# Patient Record
Sex: Male | Born: 1960 | Race: White | Hispanic: No | Marital: Married | Smoking: Former smoker
Health system: Southern US, Community
[De-identification: ages and names within clinical notes are randomized; demographics above are authoritative.]

## PROBLEM LIST (undated history)

## (undated) DIAGNOSIS — R7303 Prediabetes: Secondary | ICD-10-CM

## (undated) DIAGNOSIS — M7989 Other specified soft tissue disorders: Secondary | ICD-10-CM

## (undated) DIAGNOSIS — E785 Hyperlipidemia, unspecified: Secondary | ICD-10-CM

## (undated) DIAGNOSIS — G4730 Sleep apnea, unspecified: Secondary | ICD-10-CM

## (undated) DIAGNOSIS — J449 Chronic obstructive pulmonary disease, unspecified: Secondary | ICD-10-CM

## (undated) DIAGNOSIS — E559 Vitamin D deficiency, unspecified: Secondary | ICD-10-CM

## (undated) DIAGNOSIS — K829 Disease of gallbladder, unspecified: Secondary | ICD-10-CM

## (undated) DIAGNOSIS — M1990 Unspecified osteoarthritis, unspecified site: Secondary | ICD-10-CM

## (undated) DIAGNOSIS — R0602 Shortness of breath: Secondary | ICD-10-CM

## (undated) DIAGNOSIS — T7840XA Allergy, unspecified, initial encounter: Secondary | ICD-10-CM

## (undated) DIAGNOSIS — K2270 Barrett's esophagus without dysplasia: Secondary | ICD-10-CM

## (undated) DIAGNOSIS — M549 Dorsalgia, unspecified: Secondary | ICD-10-CM

## (undated) DIAGNOSIS — G4733 Obstructive sleep apnea (adult) (pediatric): Secondary | ICD-10-CM

## (undated) DIAGNOSIS — K219 Gastro-esophageal reflux disease without esophagitis: Secondary | ICD-10-CM

## (undated) DIAGNOSIS — R079 Chest pain, unspecified: Secondary | ICD-10-CM

## (undated) HISTORY — DX: Vitamin D deficiency, unspecified: E55.9

## (undated) HISTORY — DX: Allergy, unspecified, initial encounter: T78.40XA

## (undated) HISTORY — DX: Hyperlipidemia, unspecified: E78.5

## (undated) HISTORY — DX: Gastro-esophageal reflux disease without esophagitis: K21.9

## (undated) HISTORY — DX: Sleep apnea, unspecified: G47.30

## (undated) HISTORY — DX: Unspecified osteoarthritis, unspecified site: M19.90

## (undated) HISTORY — DX: Shortness of breath: R06.02

## (undated) HISTORY — PX: CARPAL TUNNEL RELEASE: SHX101

## (undated) HISTORY — DX: Barrett's esophagus without dysplasia: K22.70

## (undated) HISTORY — PX: KNEE ARTHROSCOPY: SUR90

## (undated) HISTORY — DX: Prediabetes: R73.03

## (undated) HISTORY — PX: NECK SURGERY: SHX720

## (undated) HISTORY — DX: Chest pain, unspecified: R07.9

## (undated) HISTORY — PX: APPENDECTOMY: SHX54

## (undated) HISTORY — DX: Obstructive sleep apnea (adult) (pediatric): G47.33

## (undated) HISTORY — DX: Other specified soft tissue disorders: M79.89

## (undated) HISTORY — PX: SHOULDER SURGERY: SHX246

## (undated) HISTORY — DX: Dorsalgia, unspecified: M54.9

## (undated) HISTORY — DX: Disease of gallbladder, unspecified: K82.9

## (undated) HISTORY — PX: CHOLECYSTECTOMY: SHX55

## (undated) HISTORY — DX: Chronic obstructive pulmonary disease, unspecified: J44.9

---

## 2000-07-18 ENCOUNTER — Inpatient Hospital Stay (HOSPITAL_COMMUNITY): Admission: EM | Admit: 2000-07-18 | Discharge: 2000-07-20 | Payer: Self-pay | Admitting: Emergency Medicine

## 2000-07-18 ENCOUNTER — Encounter: Payer: Self-pay | Admitting: Emergency Medicine

## 2001-07-01 ENCOUNTER — Emergency Department (HOSPITAL_COMMUNITY): Admission: EM | Admit: 2001-07-01 | Discharge: 2001-07-02 | Payer: Self-pay | Admitting: Internal Medicine

## 2001-07-02 ENCOUNTER — Encounter: Payer: Self-pay | Admitting: Emergency Medicine

## 2004-09-03 ENCOUNTER — Encounter: Admission: RE | Admit: 2004-09-03 | Discharge: 2004-09-03 | Payer: Self-pay | Admitting: Family Medicine

## 2004-11-30 ENCOUNTER — Encounter: Admission: RE | Admit: 2004-11-30 | Discharge: 2004-11-30 | Payer: Self-pay | Admitting: Family Medicine

## 2007-01-22 ENCOUNTER — Ambulatory Visit (HOSPITAL_BASED_OUTPATIENT_CLINIC_OR_DEPARTMENT_OTHER): Admission: RE | Admit: 2007-01-22 | Discharge: 2007-01-22 | Payer: Self-pay | Admitting: Orthopedic Surgery

## 2007-02-17 ENCOUNTER — Ambulatory Visit (HOSPITAL_BASED_OUTPATIENT_CLINIC_OR_DEPARTMENT_OTHER): Admission: RE | Admit: 2007-02-17 | Discharge: 2007-02-17 | Payer: Self-pay | Admitting: Orthopedic Surgery

## 2008-01-10 ENCOUNTER — Emergency Department (HOSPITAL_COMMUNITY): Admission: EM | Admit: 2008-01-10 | Discharge: 2008-01-10 | Payer: Self-pay | Admitting: Emergency Medicine

## 2008-02-03 ENCOUNTER — Encounter (INDEPENDENT_AMBULATORY_CARE_PROVIDER_SITE_OTHER): Payer: Self-pay | Admitting: Orthopedic Surgery

## 2008-02-03 ENCOUNTER — Ambulatory Visit (HOSPITAL_BASED_OUTPATIENT_CLINIC_OR_DEPARTMENT_OTHER): Admission: RE | Admit: 2008-02-03 | Discharge: 2008-02-03 | Payer: Self-pay | Admitting: Orthopedic Surgery

## 2008-10-12 ENCOUNTER — Ambulatory Visit: Payer: Self-pay | Admitting: Gastroenterology

## 2008-10-12 DIAGNOSIS — K591 Functional diarrhea: Secondary | ICD-10-CM | POA: Insufficient documentation

## 2008-10-12 DIAGNOSIS — K219 Gastro-esophageal reflux disease without esophagitis: Secondary | ICD-10-CM | POA: Insufficient documentation

## 2008-10-12 DIAGNOSIS — K644 Residual hemorrhoidal skin tags: Secondary | ICD-10-CM | POA: Insufficient documentation

## 2009-01-02 ENCOUNTER — Ambulatory Visit: Payer: Self-pay | Admitting: Gastroenterology

## 2009-01-16 ENCOUNTER — Ambulatory Visit: Payer: Self-pay | Admitting: Gastroenterology

## 2009-01-16 ENCOUNTER — Encounter: Payer: Self-pay | Admitting: Gastroenterology

## 2009-01-19 ENCOUNTER — Encounter: Payer: Self-pay | Admitting: Gastroenterology

## 2009-01-31 ENCOUNTER — Telehealth: Payer: Self-pay | Admitting: Gastroenterology

## 2009-02-16 ENCOUNTER — Encounter: Payer: Self-pay | Admitting: Gastroenterology

## 2009-03-15 ENCOUNTER — Encounter: Payer: Self-pay | Admitting: Gastroenterology

## 2009-03-30 ENCOUNTER — Observation Stay (HOSPITAL_COMMUNITY): Admission: EM | Admit: 2009-03-30 | Discharge: 2009-04-01 | Payer: Self-pay | Admitting: Emergency Medicine

## 2009-03-30 ENCOUNTER — Encounter (INDEPENDENT_AMBULATORY_CARE_PROVIDER_SITE_OTHER): Payer: Self-pay | Admitting: Surgery

## 2009-04-05 ENCOUNTER — Encounter: Payer: Self-pay | Admitting: Gastroenterology

## 2009-12-14 ENCOUNTER — Ambulatory Visit (HOSPITAL_COMMUNITY)
Admission: RE | Admit: 2009-12-14 | Discharge: 2009-12-15 | Payer: Self-pay | Source: Home / Self Care | Admitting: Orthopedic Surgery

## 2010-03-18 IMAGING — RF DG CERVICAL SPINE 2 OR 3 VIEWS
1 series · 5 of 5 positions shown · non-contrast
Comparison: Cervical spine x-rays 12/11/2009.

CLINICAL DATA: ACDF C5-C7.

OPERATIVE CERVICAL SPINE - 2-3 VIEW 12/14/2009:

[Series 1: run · 5 of 5 slices shown]
[im 1/5]
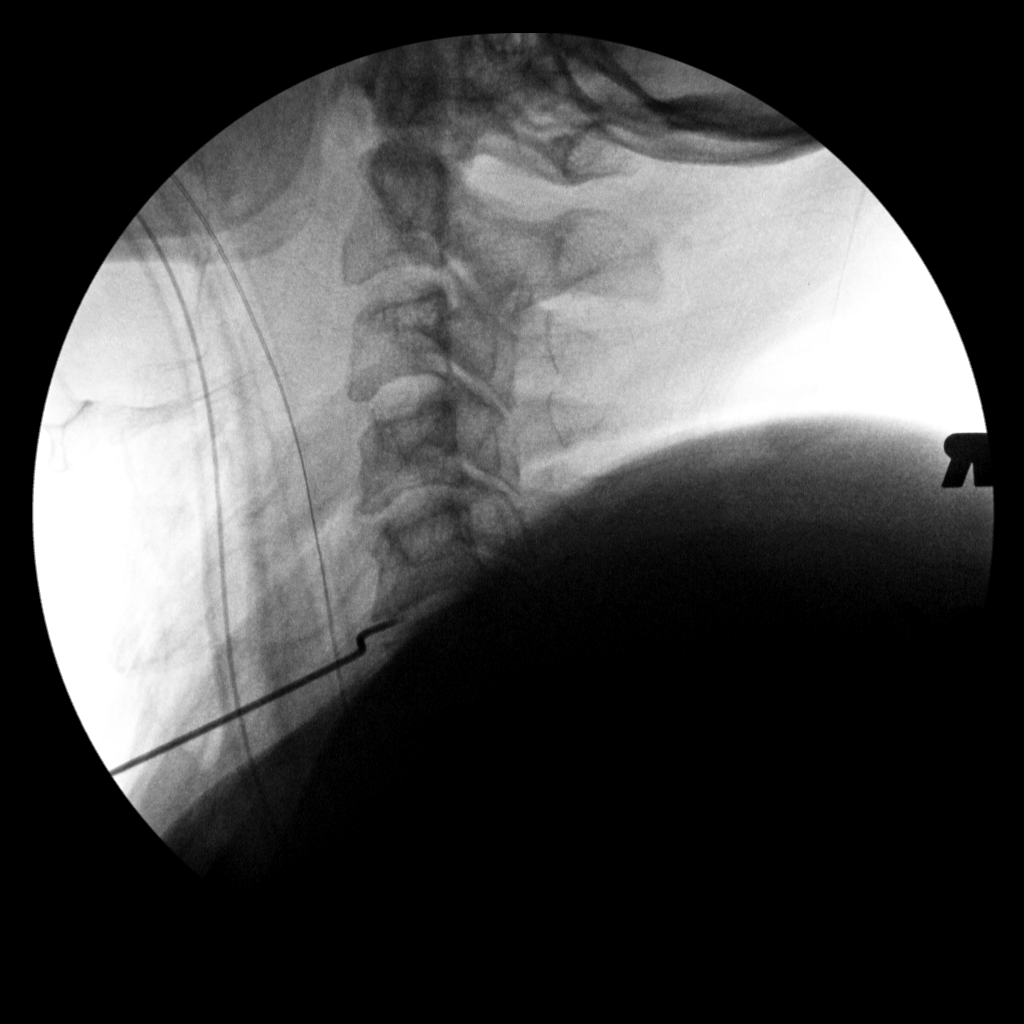
[im 2/5]
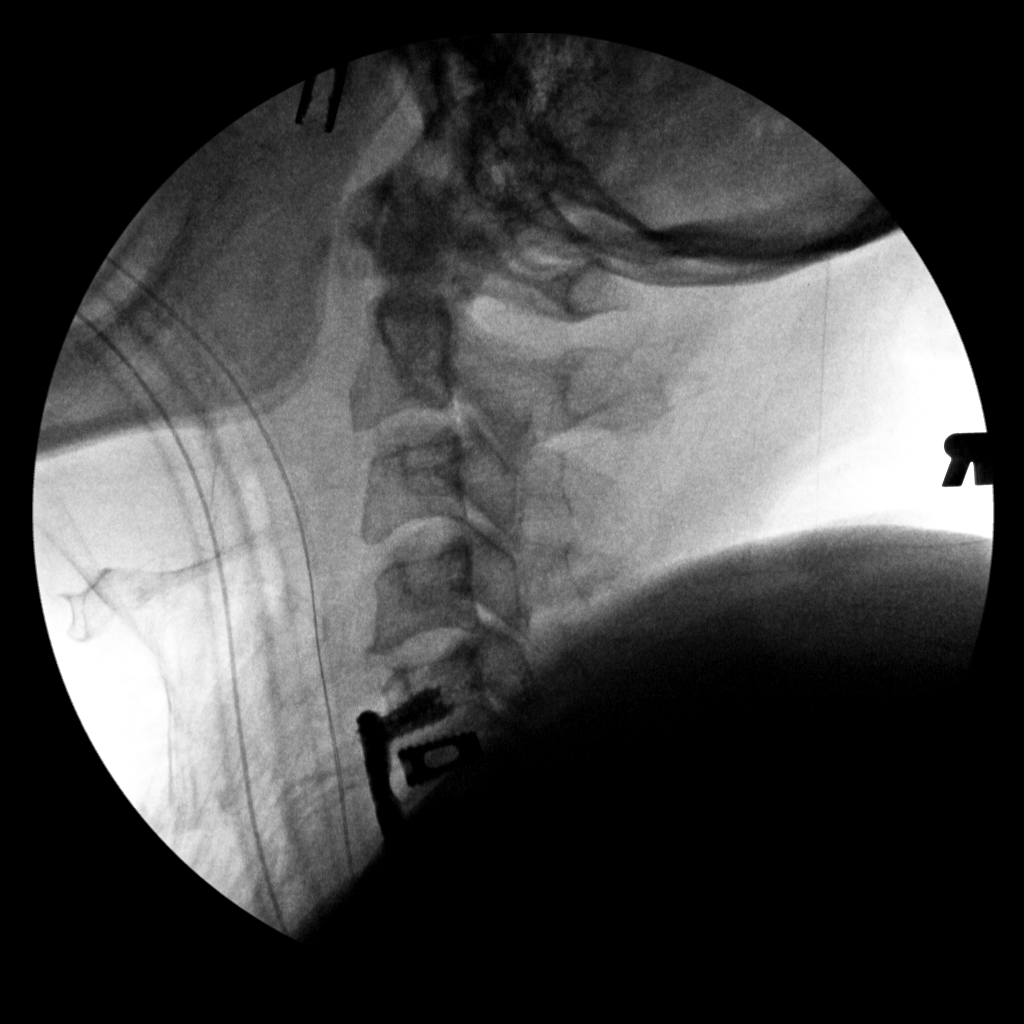
[im 3/5]
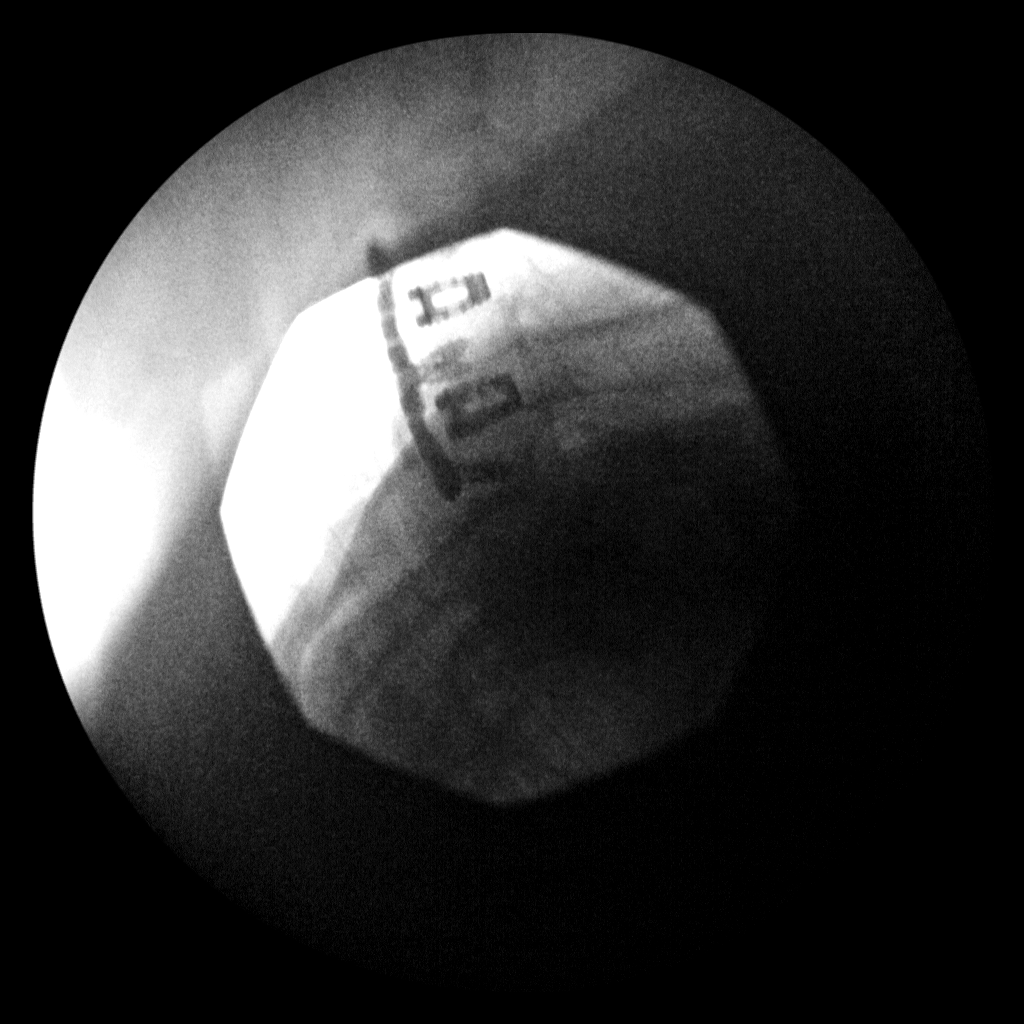
[im 4/5]
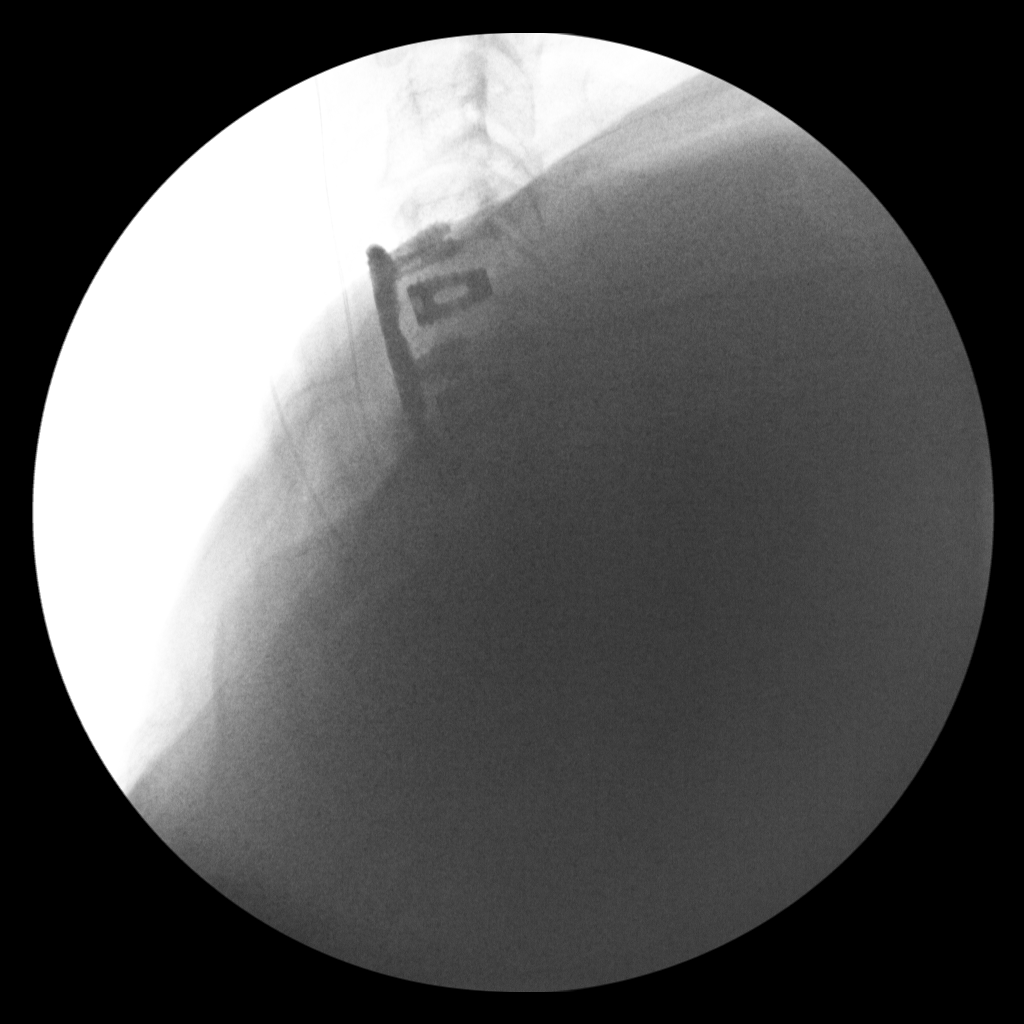
[im 5/5]
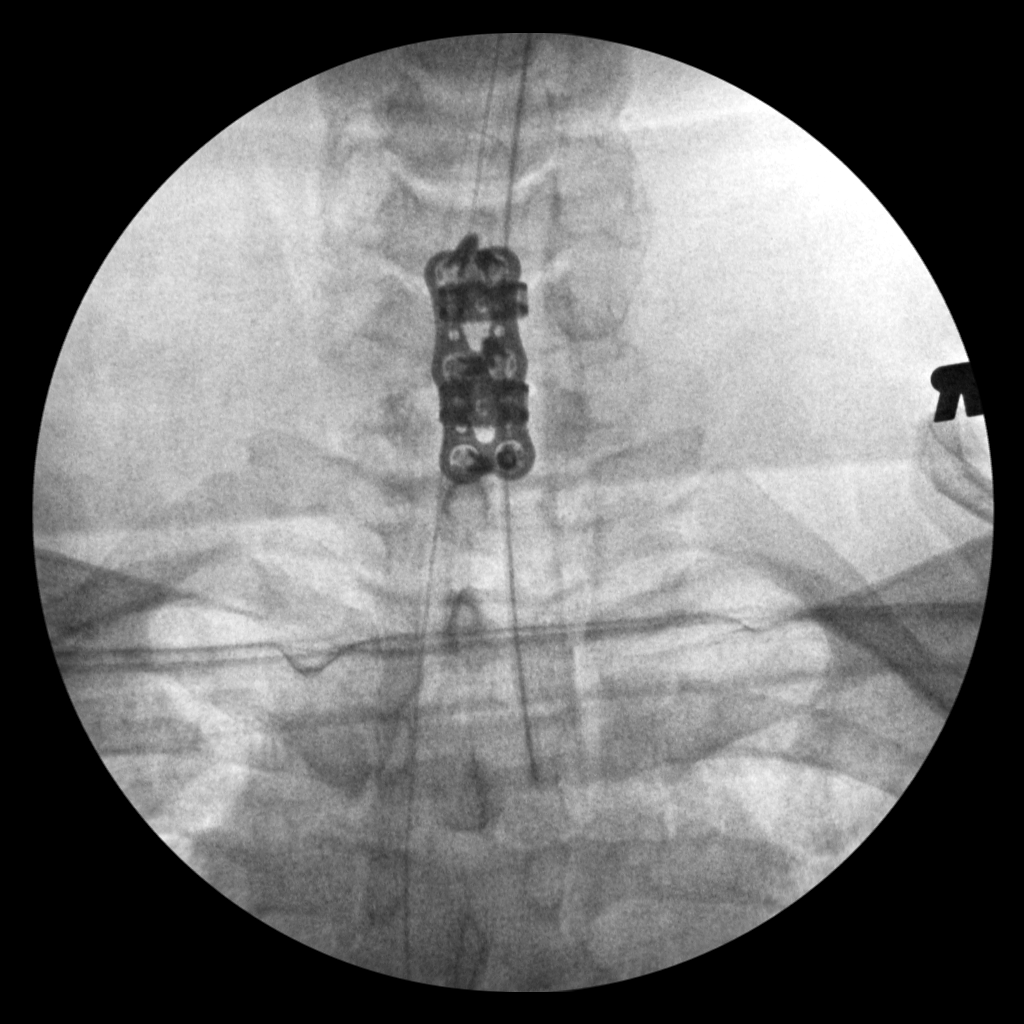

[5 of 5 positions shown; findings below may reference images not displayed]

FINDINGS: 5 spot images from the C-arm fluoroscopic device, AP and
lateral views of the cervical spine, were submitted for
interpretation post-operatively.  Initial image at 1119 hours
demonstrates the localizer needle projected over the C5-6 disc
space.  The completion images at 4313 and 9399 hours demonstrate
ACDF with hardware from C5-C7 with interbody fusion plugs.
Alignment appears anatomic.  The radiologic technologist documented
24 seconds of fluoroscopy time.
IMPRESSION: C5-C7 ACDF with anatomic alignment.

## 2010-12-11 ENCOUNTER — Ambulatory Visit (HOSPITAL_COMMUNITY)
Admission: RE | Admit: 2010-12-11 | Discharge: 2010-12-11 | Payer: Self-pay | Source: Home / Self Care | Attending: Otolaryngology | Admitting: Otolaryngology

## 2011-01-27 LAB — CBC
Hemoglobin: 15.3 g/dL (ref 13.0–17.0)
MCV: 90 fL (ref 78.0–100.0)
RBC: 4.92 MIL/uL (ref 4.22–5.81)
WBC: 9.8 10*3/uL (ref 4.0–10.5)

## 2011-02-19 LAB — DIFFERENTIAL
Basophils Absolute: 0 10*3/uL (ref 0.0–0.1)
Basophils Relative: 0 % (ref 0–1)
Eosinophils Relative: 3 % (ref 0–5)
Monocytes Absolute: 1.3 10*3/uL — ABNORMAL HIGH (ref 0.1–1.0)

## 2011-02-19 LAB — COMPREHENSIVE METABOLIC PANEL
Albumin: 3.8 g/dL (ref 3.5–5.2)
Alkaline Phosphatase: 96 U/L (ref 39–117)
BUN: 11 mg/dL (ref 6–23)
CO2: 25 mEq/L (ref 19–32)
Chloride: 102 mEq/L (ref 96–112)
Creatinine, Ser: 1.02 mg/dL (ref 0.4–1.5)
GFR calc Af Amer: >60 mL/min (ref >60)
Glucose, Bld: 116 mg/dL — ABNORMAL HIGH (ref 70–99)
Total Protein: 7.5 g/dL (ref 6.0–8.3)

## 2011-02-19 LAB — CBC
HCT: 38.8 % — ABNORMAL LOW (ref 39.0–52.0)
HCT: 39.4 % (ref 39.0–52.0)
HCT: 44.8 % (ref 39.0–52.0)
Hemoglobin: 13.6 g/dL (ref 13.0–17.0)
Hemoglobin: 15.2 g/dL (ref 13.0–17.0)
MCV: 88.4 fL (ref 78.0–100.0)
Platelets: 229 10*3/uL (ref 150–400)
Platelets: 238 10*3/uL (ref 150–400)
RBC: 4.4 MIL/uL (ref 4.22–5.81)
WBC: 14.7 10*3/uL — ABNORMAL HIGH (ref 4.0–10.5)
WBC: 19.3 10*3/uL — ABNORMAL HIGH (ref 4.0–10.5)

## 2011-02-19 LAB — URINALYSIS, ROUTINE W REFLEX MICROSCOPIC
Glucose, UA: NEGATIVE mg/dL
Specific Gravity, Urine: 1.025 (ref 1.005–1.030)

## 2011-02-19 LAB — HEPATIC FUNCTION PANEL
AST: 42 U/L — ABNORMAL HIGH (ref 0–37)
Albumin: 2.9 g/dL — ABNORMAL LOW (ref 3.5–5.2)
Total Bilirubin: 1.3 mg/dL — ABNORMAL HIGH (ref 0.3–1.2)

## 2011-02-19 LAB — LIPASE, BLOOD: Lipase: 12 U/L (ref 11–59)

## 2011-02-19 LAB — CREATININE, SERUM
Creatinine, Ser: 1.13 mg/dL (ref 0.4–1.5)
GFR calc non Af Amer: >60 mL/min (ref >60)

## 2011-02-19 LAB — BILIRUBIN, TOTAL: Total Bilirubin: 1.3 mg/dL — ABNORMAL HIGH (ref 0.3–1.2)

## 2011-02-19 LAB — URINE MICROSCOPIC-ADD ON

## 2011-03-26 NOTE — Op Note (Signed)
NAMEZAYLAN, KISSOON                 ACCOUNT NO.:  [id number]   MEDICAL RECORD NO.:  [id number]          PATIENT TYPE:  INP   LOCATION:  0109                         FACILITY:  [facility]   PHYSICIAN:  Ardeth Sportsman, MD     DATE OF BIRTH:  November 03, 1961   DATE OF PROCEDURE:  03/30/2009  DATE OF DISCHARGE:                               OPERATIVE REPORT   PRIMARY CARE PHYSICIAN:  Brett Canales A. Cleta Alberts, M.D.   ER PHYSICIAN:  Trudi Ida. Denton Lank, M.D.   SURGEON:  Karie Soda, M.D.   ASSISTANT:  Gabrielle Dare. Janee Morn, M.D.   PREOPERATIVE DIAGNOSIS:  Acute cholecystitis.   POSTOPERATIVE DIAGNOSIS:  Acute cholecystitis.   PROCEDURE PERFORMED:  Laparoscopic lysis of adhesions x 45 minutes which  was half of the case.  Laparoscopic cholecystectomy.   ANESTHESIA:  1. General anesthesia.  2. Local anesthetic in a field block all port sites.   SPECIMEN:  Gallbladder.   DRAINS:  19-French Harrison Mons drain rests behind the right lobe of the liver  and then comes up to lay over the porta hepatis.   ESTIMATED BLOOD LOSS:  100 mL.   COMPLICATIONS:  None.   INDICATIONS:  Mr. Odor is a 50 year old heavy smoker who has had 3  days of worsening abdominal pain and was convinced by his wife to get  evaluated and urgent care was strongly suspicious for cholecystitis and  was sent to the [facility] emergency department where ultrasound  confirmed this with significant leukocytosis.  Surgical consultation was  requested.   The anatomy and physiology of hepatobiliary pancreatic function was  discussed.  Pathophysiology of cholecystitis was discussed.  Options  discussed, recommendations made for laparoscopic cholecystectomy.  Risks, benefits, alternatives discussed.  Questions answered, he agreed  to proceed.   OPERATIVE FINDINGS:  He had a very thickened gallbladder wall with dense  adhesions of omentum and mesocolon and duodenum to his gallbladder which  was full of large stones consistent with acute  cholecystitis.   DESCRIPTION OF PROCEDURE:  After informed consent was confirmed, he was  already on IV Zosyn.  He had sequential pressure devices active the  entire case.  He underwent anesthesia without much difficulty.  Foley  catheter sterilely placed.  He was positioned supine with both arms  tucked.  His abdomen was clipped, prepped and draped in sterile fashion.   A #5 mm port was placed in right upper quadrant using optical entry  technique with the patient steep reverse Trendelenburg and right-side  up.  Camera inspection revealed no intra-abdominal injury but a large  wad of omentum and inflammation on his liver.  Under visualization,  final ports were placed in the right anterior axillary line in the right  flank.  Another one was placed through the umbilicus and 12 mm port was  placed in the subxiphoid region and another port was placed in the  supraumbilically along the midline for actually total of 5 ports, one in  the right flank, one the right upper quadrant, one through the  umbilicus, one supraumbilically and one the subxiphoid region. Of note  he had a lot of wheezing and bucking during the case and took some time  to control from heavy smoking.  He had some moderate secretions as well.    The greater omentum was carefully peeled back and I encountered some  bleeding controlled.  I could see the dome of the gallbladder, with that  I tried to grasp it but it was too thickened to grasp.  Using careful  mainly blunt as well as focused hook cautery dissection, I was able to  free the greater omentum off the anterior wall of the gallbladder.  Enough working space was created such that I could put decompression in  and aspirate thickened dark bile to help contract the gallbladder down  although it was obvious the gallbladder wall was very thick.  Gallbladder fundus was grasped, elevated cephalad and I was able to  further peel off the greater omentum and mesocolon and  duodenum down.   We had to get another grasper help push the retroperitoneum posteriorly  since his omentum and colon and duodenum were so inflamed and swollen it  limited our working space.  With that, we could see the infundibulum of  the gallbladder.  I was able to score the peritoneum on the  posterolateral and anteromedial aspects of the gallbladder to the liver.  I was able to get some better mobilization and get the infundibulum  pulled out and lateral pulled inferolaterally.  Eventually with that I  was able to find the cystic duct.  Further dissection was done around it  circumferentially until I could see two structures going from the  gallbladder down to the porta hepatis consistent with cystic duct and  cystic artery.  One clip on gallbladder side and four clips were placed  more distally on the cystic duct.  I decided not to do a cholangiogram  as there was extremely thickened and the angulation was difficult to  probably cannulate anyway.  His LFTs had been normal so I went ahead and  transected the cystic duct.  The anterior branch of the cystic artery  was isolated and two clips on gallbladder side, one clip slightly more  proximally & this was transected.  On further dissection was another  branch coming off the posterior wall so therefore the initial cystic  artery was more of anterior branch.  On dissection there was some  bleeding on the posterior artery posterior branch of the cystic artery  which I was able to clamp and isolate it, further skeletonized to make  sure I was not involved in hepatic artery and placed two clips on that  for good control.   The gallbladder was carefully elevated and the cautery was used at high-  dose to help free the gallbladder off the posterior wall of the  posterior gallbladder off the liver bed.  Ended up having to leave some  parts of the thickened gallbladder wall as well to avoid any  intrahepatic injury.  Eventually was able to  completely free it all off.  Gallbladder was placed inside an EndoCatch bag and had to remove out the  subxiphoid port and extend excision about 4 cm to get it out it was so  thick and even that took some pressure.  The port was replaced.   Camera inspection revealed hemostasis was pretty good.  Copious  irrigation was done.  Meticulous hemostasis was assured on the  gallbladder fossa, on the hepatic fossa with the gallbladder was.  Clips  were intact  on the posterior anterior branch cystic artery and the  cystic stump.  Over 4 liters irrigation was done with clear return at  the end.  Hemostasis good on the greater omentum.  The colon duodenum  was inspected, there was no evidence of any injury.  There was no injury  elsewhere.  The capnoperitoneum was evacuated.  A drain was placed.   Drain was placed as noted above.  It was secured to the skin using 2-0  nylon stitch.  Capnoperitoneum was evacuated.  Ports removed.  The  subxiphoid fascial defect was closed using interrupted 0 Vicryl figure-  of-eight stitches x4.  Skin was closed using 4-0 Monocryl stitch.  Sterile dressings applied.  The patient was due to be extubated in the  recovery room, about to discuss things with the family.      Ardeth Sportsman, MD  Electronically Signed     SCG/MEDQ  D:  03/30/2009  T:  03/31/2009  Job:  [id number]

## 2011-03-26 NOTE — Op Note (Signed)
Brian Steele, Brian Steele                 ACCOUNT NO.:  [id number]   MEDICAL RECORD NO.:  [id number]          PATIENT TYPE:  AMB   LOCATION:  DSC                          FACILITY:  [facility]   PHYSICIAN:  Matthew A. 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   PROCEDURE:  Left index finger proximal interphalangeal joint  capsulectomy and mass excision.   SURGEON:  Artist Pais. Mina Marble, M.D.   ASSISTANT:  None.   ANESTHESIA:  General.   TOURNIQUET TIME:  30 minutes.   No complications.  No drains.   OPERATIVE REPORT:   COMPLICATIONS:  The patient was taken to the operating suite.  After the  induction of adequate general anesthesia, the left upper extremity was  prepped and draped in a sterile fashion.  An Esmarch was used to  exsanguinate the limb, a tourniquet inflated to 250 mm.  At this point  in time a lazy S incision was made over the PIP joint using his old  incision.  Flaps were raised accordingly and tied down with 4-0 nylon.  A cystic mass was seen coming off the ulnar side of the proximal  phalangeal joint around the Ethibond suture.  The Ethibond suture, which  had been used for repair, was excised as well as the cystic mass.  Next  incisions were made on the radial and lateral sides of the interval  between the common extensor and the lateral bands and a Therapist, nutritional  was used to carefully manipulate the proximal phalangeal joint into full  flexion and full extension.  The wound was then thoroughly irrigated.  Hemostasis was achieved with bipolar cautery and was loosely closed with  4-0 nylon.  Xeroform, 4x4s, and a compressive wrap were  applied.  The  patient tolerated the procedure well was taken to the recovery room in a  stable fashion.      Artist Pais Mina Marble, M.D.  Electronically Signed    MAW/MEDQ  D:  02/03/2008  T:  02/03/2008  Job:  [id number]

## 2011-03-26 NOTE — H&P (Signed)
NAMESELESTINO, NILA                 ACCOUNT NO.:  [id number]   MEDICAL RECORD NO.:  [id number]          PATIENT TYPE:  EMS   LOCATION:  ED                           FACILITY:  [facility]   PHYSICIAN:  Ardeth Sportsman, MD     DATE OF BIRTH:  Feb 06, 1961   DATE OF ADMISSION:  03/30/2009  DATE OF DISCHARGE:                              HISTORY & PHYSICAL   PRIMARY CARE PHYSICIAN:  Dr. Stan Head. Daub over at [facility], [address].   ER PHYSICIAN:  Dr. Cathren Laine.   REASON FOR EVALUATION:  Acute cholecystitis.   HISTORY OF PRESENT ILLNESS:  Mr. Privott is a 50 year old obese male  smoker with GERD and hypercholesterolemia issues, stabilized and rather  functional, there are no major cardiopulmonary issues.  He has never had  any history of any abdominal problems aside from appendectomy 20 years  ago.  He normally has a bowel movement about 2 to 3 times a day and has  pretty good exercise tolerance where he can walk several miles a day.  He stays busy working as a Designer, fashion/clothing.  He recently had hemorrhoidectomy  surgery by Dr. Claud Kelp.  He had this last month and is recovering  from that relatively well.   About 4 days ago, he started having severe abdominal pain in his right  upper quadrant and felt nauseated, and had some dry heaves.  The pain  never totally resolved and began to intensify again in the past couple  of days.  His wife finally convinced him to be evaluated [facility], and Dr. Cleta Alberts was concerned about cholecystitis, given the  fact that the patient had leukocytosis and exquisite tenderness in the  right upper quadrant with Murphy sign.  Based on concerns, he was sent  to [facility] Emergency Department and ultrasound confirms suspicion of  acute cholecystitis, and therefore, surgical consultations requested.   The patient denies any sick contacts or travel history.  He has some  mild reflux disease that is well controlled on his proton  pump inhibitor  and this does not seem like that at all.  No recent dysphagia to solids  or liquids.  No hematochezia, melena, hematemesis.  He does not recall  ever having any pains or symptoms like this before.  He usually can  tolerate most foods rather well.  He does have a very strong appetite  according to his wife, but otherwise no change.  No history of irritable  bowel syndrome, inflammatory bowel disease in the patient or his family.   PAST MEDICAL HISTORY:  Gastroesophageal reflux disease.  Obesity.  Tobacco abuse.  Hypercholesterolemia.   PAST SURGICAL HISTORY:  Appendectomy done 20 years ago.   MEDICATIONS:  Include Prevacid and simvastatin.   ALLERGIES:  None known.   SOCIAL HISTORY:  He is married in a stable relationship.  He works as a  Designer, fashion/clothing and has moderate activity with that.  Rarely drinks alcohol.  No  drugs.  He smokes about a pack per day for about a 40 pack-year history  of tobacco.   FAMILY HISTORY:  Again, negative for any recent concerning GI problems  or early cardiopulmonary disease that he can recall.   REVIEW OF SYSTEMS:  As noted per HPI.  CONSTITUTIONAL:  Negative for any  fevers or chills.  His weight has been stable.  HEENT, CARDIOVASCULAR,  RESPIRATORY:  Negative.  GASTROINTESTINAL:  As noted above.  GENITOURINARY, PSYCH, MUSCULOSKELETAL, DERMATOLOGIC, NEUROLOGIC:  Negative.  HEPATIC, RENAL, ENDOCRINE:  Otherwise negative, although the  wife wonders on hepatic whether he has been a little bit jaundiced.  ALLERGIC:  Negative.   PHYSICAL EXAM:  VITAL SIGNS:  Note a temperature of 97.9, blood pressure  153/87, pulse 89, respirations 20.  Initially 10/10 pain down to about  6/10 pain after getting IV narcotics.  He has 98% saturation on room  air.  GENERAL:  He is a well-developed, well-nourished obese male  uncomfortable and tired but not toxic.  EYES:  Pupils equal, round, and reactive to light.  Extraocular  movements intact.  Sclerae a  little bit injected, but no definite  evidence of any icterus.  PSYCH:  He seems to have at least average intelligence with good  insight.  No evidence of any delirium, psychosis, or paranoia.  HENT:  Normocephalic.  Mucous membranes are dry.  Nasopharynx and  oropharynx clear.  NECK:  Supple.  No masses.  Trachea midline.  LYMPHATICS:  No head, neck, axillary, or groin lymphadenopathy.  HEART:  Regular rate and rhythm.  No murmurs, rubs, or clicks.  CHEST:  Clear to auscultation bilaterally.  No wheeze, rales, or  rhonchi.  Pain and some discomfort on his right lateral posterior chest  wall, but the left is clear.  ABDOMEN:  Obese but soft.  He is very tender over his right upper  quadrant with Murphy sign.  Epigastrium has only mild discomfort.  Right  lower quadrant is clear.  Left side of his abdomen is soft.  He has no  umbilical hernia.  He is obese.  GENITOURINARY:  Normal male external genitalia.  No inguinal hernias.  RECTAL:  Deferred.  MUSCULOSKELETAL:  Good range of motion in shoulders, elbows, wrists, as  well as hips, knees, and ankles.  SKIN:  No petechia, purpura, no other source of lesions.  BREASTS:  No obvious nipple discharge or masses.   DIAGNOSTIC STUDIES:  His white count is elevated at 19.3.  Comprehensive  metabolic panel was underwhelming.  Urinalysis is a little bit dirty,  but ultrasound shows gallbladder wall thickening up to 6 mm in size with  a large gall stone 1.7 cm in size.  No ductal dilatation.   ASSESSMENT AND PLAN:  A 50 year old male smoker with well controlled  gastroesophageal reflux disease with classic story for biliary colic,  now constant, and leukocytosis with ultrasound and physical exam, et  Karie Soda, concerning for acute cholecystitis.   PLAN:  1. Admit.  2. IV antibiotics.  3. IV fluids.  4. Laparoscopic cholecystectomy with cholangiogram.  5. The anatomy and physiology of hepatobiliary and pancreatic function      was discussed.   The pathophysiology of cholecystolithiasis was      discussed with its natural history, risks, and pathophysiology of      cholecystitis with its risks was discussed as well.  Options      discussed, recommendations, and need for laparoscopic and possible      open removal of gallbladder and study of bile ducts with possible      conversion  to open were discussed.  Risks of bleeding, infection,      postoperative abscess, the need for drain and other issues were      discussed.  Questions answered, agrees to proceed.  6. Double PPI given his history of gastroesophageal reflux disease.  7. Deep vein thrombosis prophylaxis.  8. Start on beta blocker given his smoking status and obesity for      cardio protection.  9. Preoperative EKG.   The plan was discussed with the patient and wife and they agree with  this.      Ardeth Sportsman, MD  Electronically Signed     SCG/MEDQ  D:  03/30/2009  T:  03/30/2009  Job:  [id number]   cc:   Brett Canales A. Cleta Alberts, M.D.  Fax: [phone]   Trudi Ida. Denton Lank, M.D.  Fax: [phone]   Angelia Mould. Derrell Lolling, M.D.  [address]  [place]  [place] [address]

## 2011-03-29 NOTE — Op Note (Signed)
Brian Steele, Brian Steele                 ACCOUNT NO.:  [id number]   MEDICAL RECORD NO.:  [id number]          PATIENT TYPE:  AMB   LOCATION:  DSC                          FACILITY:  [facility]   PHYSICIAN:  Katy Fitch. Sypher, M.D. DATE OF BIRTH:  07-Jan-1961   DATE OF PROCEDURE:  02/17/2007  DATE OF DISCHARGE:                               OPERATIVE REPORT   PREOPERATIVE DIAGNOSIS:  Entrapment neuropathy, median nerve, right  carpal tunnel.   POSTOPERATIVE DIAGNOSIS:  Entrapment neuropathy, median nerve, right  carpal tunnel.   OPERATION:  Release of right transverse carpal ligament.   OPERATIONS:  Katy Fitch. Sypher, M.D.   ASSISTANT:  Molly Maduro Dasnoit PA-C.   ANESTHESIA:  General by LMA, supervising anesthesiologist Dr. Jean Rosenthal.   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  He has a history of  bilateral carpal tunnel syndrome.  Previous electrodiagnostic studies  confirmed significant bilateral carpal tunnel syndrome.  He is status  post release of his left transverse carpal ligament with good early  relief of his numbness and discomfort.  He is still rehabilitating the  left hand.  He now has scheduled release of his right transverse carpal  ligament.   Surgery, aftercare, potential risks and benefits were explained in  detail.  Questions were invited and answered.   PROCEDURE:  [name] was brought to the operating room and placed in  the supine position on the operating table.   Following an anesthesia consult with Dr. Jean Rosenthal, general anesthesia by  LMA was suggested by Dr. Jean Rosenthal and accepted by [name].   [name] was brought to room #6, placed in the supine position on the  table and under Dr. Edison Pace direct supervision, general anesthesia by  LMA induced.  The right arm was then prepped with Betadine soap and  solution and sterilely draped.  A pneumatic tourniquet spot was applied  to the proximal  right brachium.   Following exsanguination of the right arm with an Esmarch bandage, an  arterial tourniquet upon the proximal brachium inflated to 230 mmHg.  The procedure commenced with a short incision in the line of the ring  finger in the palm.  The subcutaneous tissues were carefully divided to  reveal the palmar fascia.  This was split longitudinally to reveal the  common sensory branch of the median nerve.  These were followed back to  the transverse carpal ligament, which was gently isolated from median  nerve.  A Penfield #4 elevator was used to clear a path superficial to  the nerve and deep to the transverse carpal ligament.   The transverse carpal ligament and the volar forearm fascia were  released subcutaneously with scissors, widely opening the carpal canal.  There were no apparent masses or other predicaments.   Bleeding points were inspected and did not require electrocautery.   The wound was then repaired with intradermal 3-0 Prolene suture.   A compressive dressing was applied with a volar plaster splint  maintaining the wrist in 5 degrees of dorsiflexion.  For aftercare [name] is provided a prescription for Percocet 5 mg  one p.o. q.4-6h. p.r.n. pain, 20 tablets without refill.   He will elevate his hand for 1 week, keep his dressing dry, and return  for follow-up to our office in 8 days for dressing change and  advancement to his exercise program.      Katy Fitch. Sypher, M.D.  Electronically Signed     RVS/MEDQ  D:  02/17/2007  T:  02/17/2007  Job:  [id number]

## 2011-03-29 NOTE — Op Note (Signed)
Brian Steele, Brian Steele                 ACCOUNT NO.:  [id number]   MEDICAL RECORD NO.:  [id number]          PATIENT TYPE:  AMB   LOCATION:  DSC                          FACILITY:  [facility]   PHYSICIAN:  Katy Fitch. Sypher, M.D. DATE OF BIRTH:  04/15/1961   DATE OF PROCEDURE:  01/22/2007  DATE OF DISCHARGE:                               OPERATIVE REPORT   PREOPERATIVE DIAGNOSIS:  Entrapped neuropathy median nerve left carpal  tunnel.   POSTOP DIAGNOSIS:  Entrapped neuropathy median nerve left carpal tunnel.   OPERATION:  Release of left transverse carpal ligament.   OPERATIONS:  Katy Fitch. Sypher, M.D.   ASSISTANT:  Marveen Reeks. Dasnoit, P.A.-C.   ANESTHESIA:  General by LMA.   SUPERVISING ANESTHESIOLOGIST:  Janetta Hora. Gelene Mink, M.D.   INDICATIONS:  Brian Steele is a 50 year old gentleman referred for  evaluation and management of hand numbness.  He is a primary care  patient of Dr. Donia Guiles.  Dr. Arvilla Market had evaluated [name]  and sent him for Electrodiagnostic studies with Dr. Marlan Palau.  These documented carpal tunnel syndrome 2 years prior.  [name]  subsequently presented for hand surgery evaluation; and had subsequent  Electrodiagnostic studies by Dr. Joyce Copa in February of 2008.  These  documented severe right carpal tunnel syndrome, and markedly severe left  carpal tunnel syndrome.   Due to the fact that [name] is more symptomatic on the left than the  right; he requested that we proceed with release of his left transverse  carpal ligament at this time.  After informed consent he was brought to  the operating room.  Questions were invited and answered in the holding  area.   DESCRIPTION OF PROCEDURE:  [name] was brought to the operating room  and placed in the supine position on the operating table.  Following the  induction of general anesthesia by LMA technique, the left arm was  prepped with Betadine soap and solution, and sterilely draped.  Following exsanguination of the right arm with an Esmarch bandage, an  arterial tourniquet in the proximal brachium was inflated to 230 mmHg.  The procedure commenced with a short incision in line of the ring finger  and the palm.  The subcutaneous tissues were carefully divided revealing  the palmar fascia.  This was split longitudinally to reveal the common  sensory branch of the median nerve.  These were followed back to the  transverse carpal ligament which was gently isolated from the median  nerve by use of a Insurance risk surveyor.  The ligament was then released  along its ulnar border with scissors extending into the distal forearm.   This widely opened carpal canal.  No mass or other predicaments were  noted.  Bleeding points along the margin of the released ligament were  electrocauterized with bipolar current, followed by repair of the skin  with intradermal 3-0 Prolene suture.  A compression dressing was applied  with a volar plaster splint, maintaining the wrist in 5 degrees of  dorsiflexion.      Katy Fitch Sypher, M.D.  Electronically Signed  RVS/MEDQ  D:  01/22/2007  T:  01/23/2007  Job:  [id number]   cc:   Donia Guiles, M.D.

## 2011-08-05 LAB — POCT HEMOGLOBIN-HEMACUE: Hemoglobin: 16.2

## 2011-11-12 HISTORY — PX: CHOLECYSTECTOMY: SHX55

## 2015-01-24 DIAGNOSIS — M25519 Pain in unspecified shoulder: Secondary | ICD-10-CM | POA: Insufficient documentation

## 2015-01-24 DIAGNOSIS — G894 Chronic pain syndrome: Secondary | ICD-10-CM | POA: Insufficient documentation

## 2015-06-19 ENCOUNTER — Encounter: Payer: Self-pay | Admitting: Gastroenterology

## 2015-11-12 DIAGNOSIS — G4730 Sleep apnea, unspecified: Secondary | ICD-10-CM | POA: Insufficient documentation

## 2016-10-05 DIAGNOSIS — G5622 Lesion of ulnar nerve, left upper limb: Secondary | ICD-10-CM | POA: Insufficient documentation

## 2019-01-29 ENCOUNTER — Encounter: Payer: Self-pay | Admitting: Gastroenterology

## 2019-07-14 HISTORY — PX: REPLACEMENT TOTAL KNEE: SUR1224

## 2020-06-22 ENCOUNTER — Encounter: Payer: Self-pay | Admitting: Family Medicine

## 2020-06-22 ENCOUNTER — Ambulatory Visit (INDEPENDENT_AMBULATORY_CARE_PROVIDER_SITE_OTHER): Payer: Medicare Other

## 2020-06-22 ENCOUNTER — Ambulatory Visit (INDEPENDENT_AMBULATORY_CARE_PROVIDER_SITE_OTHER): Payer: Medicare Other | Admitting: Family Medicine

## 2020-06-22 ENCOUNTER — Other Ambulatory Visit: Payer: Self-pay

## 2020-06-22 VITALS — BP 121/63 | HR 64 | Temp 98.3°F | Ht 67.0 in | Wt 303.2 lb

## 2020-06-22 DIAGNOSIS — G4733 Obstructive sleep apnea (adult) (pediatric): Secondary | ICD-10-CM

## 2020-06-22 DIAGNOSIS — E782 Mixed hyperlipidemia: Secondary | ICD-10-CM

## 2020-06-22 DIAGNOSIS — G894 Chronic pain syndrome: Secondary | ICD-10-CM | POA: Diagnosis not present

## 2020-06-22 DIAGNOSIS — M79671 Pain in right foot: Secondary | ICD-10-CM

## 2020-06-22 DIAGNOSIS — E785 Hyperlipidemia, unspecified: Secondary | ICD-10-CM | POA: Insufficient documentation

## 2020-06-22 NOTE — Progress Notes (Signed)
New Patient Office Visit  Assessment & Plan:  1. Chronic pain syndrome - Well controlled on current regimen. Managed by pain management.  - HYDROcodone-acetaminophen (NORCO/VICODIN) 5-325 MG tablet; Take 1 tablet by mouth 2 (two) times daily. - HYDROcodone-acetaminophen (NORCO/VICODIN) 5-325 MG tablet; Take 1 tablet by mouth 2 (two) times daily. - CMP14+EGFR  2. Obstructive sleep apnea syndrome - Compliant with CPAP.  3. Mixed hyperlipidemia - simvastatin (ZOCOR) 40 MG tablet; Take 1 tablet by mouth daily. - Lipid panel - CBC with Differential/Platelet - CMP14+EGFR  4. Pain of right heel - DG Foot Complete Right   Follow-up: Return in about 3 months (around 09/22/2020) for follow-up of chronic medication conditions.   Hendricks Limes, MSN, APRN, FNP-C [facility]  Subjective:  Patient ID: Brian Steele, male    DOB: 1961/06/13  Age: 59 y.o. MRN: [id number]  Patient Care Team: Loman Brooklyn, FNP as PCP - General (Family Medicine)  CC:  Chief Complaint  Patient presents with   New Patient (Initial Visit)    HPI Brian Steele presents to establish care. Transferring from Dr. Ellouise Newer; he has already signed a record release. He is transferring due to insurance.  He does see pain management. States his shoulder blade is "not where it is supposed to be". He has done PT, acupuncture, and had a stent placed in his back that acted like a tens unit he could control and nothing helps the pain except the Norco.   He has sleep apnea and does wear his CPAP nightly. He was previously getting supplies through [place]. He buys a lot himself off of [employer].   He is concerned about right heel pain today. He would like an x-ray to see if he has a heel spur. States he has had issues with his right ankle since he was 46-58 years of age.    Review of Systems  Constitutional: Negative for chills, fever, malaise/fatigue and weight loss.  HENT: Negative for congestion,  ear discharge, ear pain, nosebleeds, sinus pain, sore throat and tinnitus.   Eyes: Negative for blurred vision, double vision, pain, discharge and redness.  Respiratory: Negative for cough, shortness of breath and wheezing.   Cardiovascular: Negative for chest pain, palpitations and leg swelling.  Gastrointestinal: Negative for abdominal pain, constipation, diarrhea, heartburn, nausea and vomiting.  Genitourinary: Negative for dysuria, frequency and urgency.  Musculoskeletal: Positive for joint pain. Negative for myalgias.  Skin: Negative for rash.  Neurological: Negative for dizziness, seizures, weakness and headaches.  Psychiatric/Behavioral: Negative for depression, substance abuse and suicidal ideas. The patient is not nervous/anxious.     Current Outpatient Medications:    aspirin 81 MG EC tablet, Take 1 tablet by mouth daily., Disp: , Rfl:    fluticasone (FLONASE) 50 MCG/ACT nasal spray, Place 1 spray into both nostrils daily as needed., Disp: , Rfl:    HYDROcodone-acetaminophen (NORCO/VICODIN) 5-325 MG tablet, Take 1 tablet by mouth 2 (two) times daily., Disp: , Rfl:    HYDROcodone-acetaminophen (NORCO/VICODIN) 5-325 MG tablet, Take 1 tablet by mouth 2 (two) times daily., Disp: , Rfl:    simvastatin (ZOCOR) 40 MG tablet, Take 1 tablet by mouth daily., Disp: , Rfl:   Allergies  Allergen Reactions   Bee Venom Swelling   Other Hives    In laundry detergents  In laundry detergents     Sodium Hypochlorite Rash    Past Medical History:  Diagnosis Date   Allergy    Arthritis  Hyperlipidemia     Past Surgical History:  Procedure Laterality Date   APPENDECTOMY     CARPAL TUNNEL RELEASE     CHOLECYSTECTOMY     NECK SURGERY     SHOULDER SURGERY Left     History reviewed. No pertinent family history.  Social History   Socioeconomic History   Marital status: Married    Spouse name: Not on file   Number of children: Not on file   Years of education:  Not on file   Highest education level: Not on file  Occupational History   Not on file  Tobacco Use   Smoking status: Former Smoker    Quit date: 2018    Years since quitting: 3.6   Smokeless tobacco: Never Used  Substance and Sexual Activity   Alcohol use: Not Currently   Drug use: Never   Sexual activity: Not on file  Other Topics Concern   Not on file  Social History Narrative   Not on file   Social Determinants of Health   Financial Resource Strain:    Difficulty of Paying Living Expenses:   Food Insecurity:    Worried About Charity fundraiser in the Last Year:    Arboriculturist in the Last Year:   Transportation Needs:    Film/video editor (Medical):    Lack of Transportation (Non-Medical):   Physical Activity:    Days of Exercise per Week:    Minutes of Exercise per Session:   Stress:    Feeling of Stress :   Social Connections:    Frequency of Communication with Friends and Family:    Frequency of Social Gatherings with Friends and Family:    Attends Religious Services:    Active Member of Clubs or Organizations:    Attends Music therapist:    Marital Status:   Intimate Partner Violence:    Fear of Current or Ex-Partner:    Emotionally Abused:    Physically Abused:    Sexually Abused:     Objective:   Today's Vitals: BP 121/63    Pulse 64    Temp 98.3 F (36.8 C) (Temporal)    Ht 5' 7" (1.702 m)    Wt (!) 303 lb 3.2 oz (137.5 kg)    BMI 47.49 kg/m   Physical Exam Vitals reviewed.  Constitutional:      General: He is not in acute distress.    Appearance: Normal appearance. He is morbidly obese. He is not ill-appearing, toxic-appearing or diaphoretic.  HENT:     Head: Normocephalic and atraumatic.  Eyes:     General: No scleral icterus.       Right eye: No discharge.        Left eye: No discharge.     Conjunctiva/sclera: Conjunctivae normal.  Cardiovascular:     Rate and Rhythm: Normal rate and  regular rhythm.     Heart sounds: Normal heart sounds. No murmur heard.  No friction rub. No gallop.   Pulmonary:     Effort: Pulmonary effort is normal. No respiratory distress.     Breath sounds: Normal breath sounds. No stridor. No wheezing, rhonchi or rales.  Musculoskeletal:        General: Normal range of motion.     Cervical back: Normal range of motion.  Skin:    General: Skin is warm and dry.  Neurological:     Mental Status: He is alert and oriented to person, place, and time.  Mental status is at baseline.  Psychiatric:        Mood and Affect: Mood normal.        Behavior: Behavior normal.        Thought Content: Thought content normal.        Judgment: Judgment normal.

## 2020-06-23 LAB — CBC WITH DIFFERENTIAL/PLATELET
Basophils Absolute: 0.1 10*3/uL (ref 0.0–0.2)
Basos: 1 %
EOS (ABSOLUTE): 0.8 10*3/uL — ABNORMAL HIGH (ref 0.0–0.4)
Eos: 7 %
Hematocrit: 43.9 % (ref 37.5–51.0)
Hemoglobin: 14.8 g/dL (ref 13.0–17.7)
Immature Grans (Abs): 0.1 10*3/uL (ref 0.0–0.1)
Immature Granulocytes: 1 %
Lymphocytes Absolute: 4 10*3/uL — ABNORMAL HIGH (ref 0.7–3.1)
Lymphs: 32 %
MCH: 28.9 pg (ref 26.6–33.0)
MCHC: 33.7 g/dL (ref 31.5–35.7)
MCV: 86 fL (ref 79–97)
Monocytes Absolute: 0.9 10*3/uL (ref 0.1–0.9)
Monocytes: 7 %
Neutrophils Absolute: 6.6 10*3/uL (ref 1.4–7.0)
Neutrophils: 52 %
Platelets: 317 10*3/uL (ref 150–450)
RBC: 5.12 x10E6/uL (ref 4.14–5.80)
RDW: 14.1 % (ref 11.6–15.4)
WBC: 12.4 10*3/uL — ABNORMAL HIGH (ref 3.4–10.8)

## 2020-06-23 LAB — CMP14+EGFR
ALT: 39 IU/L (ref 0–44)
AST: 31 IU/L (ref 0–40)
Albumin/Globulin Ratio: 1.5 (ref 1.2–2.2)
Albumin: 4.5 g/dL (ref 3.8–4.9)
Alkaline Phosphatase: 135 IU/L — ABNORMAL HIGH (ref 48–121)
BUN/Creatinine Ratio: 10 (ref 9–20)
BUN: 10 mg/dL (ref 6–24)
Bilirubin Total: 0.7 mg/dL (ref 0.0–1.2)
CO2: 40 mmol/L — ABNORMAL HIGH (ref 20–29)
Calcium: 9.1 mg/dL (ref 8.7–10.2)
Chloride: 104 mmol/L (ref 96–106)
Creatinine, Ser: 1.02 mg/dL (ref 0.76–1.27)
GFR calc Af Amer: 93 mL/min/{1.73_m2} (ref >59)
GFR calc non Af Amer: 80 mL/min/{1.73_m2} (ref >59)
Globulin, Total: 3 g/dL (ref 1.5–4.5)
Glucose: 83 mg/dL (ref 65–99)
Potassium: 4.3 mmol/L (ref 3.5–5.2)
Sodium: 142 mmol/L (ref 134–144)
Total Protein: 7.5 g/dL (ref 6.0–8.5)

## 2020-06-23 LAB — LIPID PANEL
Chol/HDL Ratio: 3.4 ratio (ref 0.0–5.0)
Cholesterol, Total: 169 mg/dL (ref 100–199)
HDL: 50 mg/dL (ref >39)
LDL Chol Calc (NIH): 95 mg/dL (ref 0–99)
Triglycerides: 135 mg/dL (ref 0–149)
VLDL Cholesterol Cal: 24 mg/dL (ref 5–40)

## 2020-07-18 ENCOUNTER — Encounter: Payer: Self-pay | Admitting: Family Medicine

## 2020-12-12 ENCOUNTER — Encounter: Payer: Self-pay | Admitting: Family Medicine

## 2020-12-26 ENCOUNTER — Ambulatory Visit (INDEPENDENT_AMBULATORY_CARE_PROVIDER_SITE_OTHER): Payer: Medicare Other | Admitting: Family

## 2020-12-26 ENCOUNTER — Encounter: Payer: Self-pay | Admitting: Family

## 2020-12-26 ENCOUNTER — Other Ambulatory Visit: Payer: Self-pay

## 2020-12-26 VITALS — BP 160/74 | HR 78 | Temp 97.8°F | Ht 67.0 in | Wt 306.8 lb

## 2020-12-26 DIAGNOSIS — M79671 Pain in right foot: Secondary | ICD-10-CM

## 2020-12-26 DIAGNOSIS — M7731 Calcaneal spur, right foot: Secondary | ICD-10-CM | POA: Diagnosis not present

## 2020-12-26 MED ORDER — FLUTICASONE PROPIONATE 50 MCG/ACT NA SUSP: 1.0000 | Freq: Every day | NASAL | 2 refills | Status: DC | PRN

## 2020-12-26 MED ORDER — DICLOFENAC SODIUM 75 MG PO TBEC: 75.0000 mg | DELAYED_RELEASE_TABLET | Freq: Two times a day (BID) | ORAL | 0 refills | Status: DC

## 2020-12-26 MED ORDER — PREDNISONE 10 MG (21) PO TBPK: ORAL_TABLET | ORAL | 0 refills | Status: DC

## 2020-12-26 NOTE — Progress Notes (Signed)
Subjective:    Patient ID: Brian Steele, male    DOB: 1961/07/08, 60 y.o.   MRN: [id number]  Chief Complaint  Patient presents with  . Heel Spurs    Right foot     HPI Pt presents to the office today with right heel pain that has been on going for the last few months. He was seen in the office on 06/22/20 and had an x-ray that showed bidirectional calcaneal spurs.   He reports his pain is 6 out 10. He has Norco that he takes for his left shoulder pain that does help with his foot pain.   Review of Systems  All other systems reviewed and are negative.      Objective:   Physical Exam Vitals reviewed.  Constitutional:      General: He is not in acute distress.    Appearance: He is well-developed and well-nourished. He is obese.  HENT:     Head: Normocephalic.     Mouth/Throat:     Mouth: Oropharynx is clear and moist.  Eyes:     General:        Right eye: No discharge.        Left eye: No discharge.     Pupils: Pupils are equal, round, and reactive to light.  Neck:     Thyroid: No thyromegaly.  Cardiovascular:     Rate and Rhythm: Normal rate and regular rhythm.     Pulses: Intact distal pulses.     Heart sounds: Normal heart sounds. No murmur heard.   Pulmonary:     Effort: Pulmonary effort is normal. No respiratory distress.     Breath sounds: Normal breath sounds. No wheezing.  Abdominal:     General: Bowel sounds are normal. There is no distension.     Palpations: Abdomen is soft.     Tenderness: There is no abdominal tenderness.  Musculoskeletal:        General: Tenderness (mild tenderness in right heel with palpation) present. No edema. Normal range of motion.     Cervical back: Normal range of motion and neck supple.  Skin:    General: Skin is warm and dry.     Findings: No erythema or rash.  Neurological:     Mental Status: He is alert and oriented to person, place, and time.     Cranial Nerves: No cranial nerve deficit.     Deep Tendon Reflexes:  Reflexes are normal and symmetric.  Psychiatric:        Mood and Affect: Mood and affect normal.        Behavior: Behavior normal.        Thought Content: Thought content normal.        Judgment: Judgment normal.     BP (!) 160/74   Pulse 78   Temp 97.8 F (36.6 C) (Temporal)   Ht 5\' 7"  (1.702 m)   Wt (!) 306 lb 12.8 oz (139.2 kg)   BMI 48.05 kg/m        Assessment & Plan:  SHAWNTEZ Steele comes in today with chief complaint of Heel Spurs (Right foot )   Diagnosis and orders addressed:  1. Pain of right heel - predniSONE (STERAPRED UNI-PAK 21 TAB) 10 MG (21) TBPK tablet; Use as directed  Dispense: 21 tablet; Refill: 0 - diclofenac (VOLTAREN) 75 MG EC tablet; Take 1 tablet (75 mg total) by mouth 2 (two) times daily.  Dispense: 30 tablet; Refill: 0  2. Calcaneal  spur of right foot Rest Ice ROM exercise Good foot support No other NSAID's while taking diclofenac  Avoid high impact  - predniSONE (STERAPRED UNI-PAK 21 TAB) 10 MG (21) TBPK tablet; Use as directed  Dispense: 21 tablet; Refill: 0 - diclofenac (VOLTAREN) 75 MG EC tablet; Take 1 tablet (75 mg total) by mouth 2 (two) times daily.  Dispense: 30 tablet; Refill: 0   Brian Rodney, FNP

## 2020-12-26 NOTE — Patient Instructions (Signed)
Heel Spur  A heel spur is a bony growth that forms on the bottom of the heel bone (calcaneus). Heel spurs are common. They often cause inflammation in the plantar fascia, which is the band of tissue that connects the toe bones to the heel bone. When the plantar fascia is inflamed, it is called plantar fasciitis. This may cause pain on the bottom of the foot, near the heel. Many people with plantar fasciitis also have heel spurs. However, spurs are not the cause of plantar fasciitis pain. What are the causes? The exact cause of heel spurs is not known. They may be caused by:  Pressure on the heel bone.  Bands of tissues that connect muscle to bone (tendons) pulling on the heel bone. What increases the risk? You are more likely to develop this condition if you:  Are older than age 40.  Are overweight.  Have wear-and-tear arthritis (osteoarthritis).  Have plantar fascia inflammation.  Participate in sports or activities that include a lot of running or jumping.  Wear poorly fitted shoes. What are the signs or symptoms? Some people have no symptoms. If you do have symptoms, they may include:  Pain in the bottom of your heel.  Pain that is worse when you first get out of bed.  Pain that gets worse after walking or standing. How is this diagnosed? This condition may be diagnosed based on:  Your symptoms and medical history.  A physical exam.  A foot X-ray. How is this treated? Treatment for this condition depends on how much pain you have. Treatment options may include:  Doing stretching exercises and losing weight, if necessary.  Wearing specific shoes or inserts inside of shoes (orthotics) for comfort and support.  Wearing splints on your feet while you sleep. Splints keep your feet in a position (usually a 90-degree angle) that should prevent and relieve the pain you feel when you first get out of bed. They also make stretching easier in the morning.  Taking  over-the-counter medicine to relieve pain, such as NSAIDs.  Using high-intensity sound waves to break up the heel spur (extracorporeal shock wave therapy).  Getting steroid injections in your heel to reduce inflammation.  Having surgery, if your heel spur causes long-term (chronic) pain. Follow these instructions at home: Activity  Avoid activities that cause pain until you recover, or for as long as told by your health care provider.  Do stretching exercises as told. Stretch before exercising or being physically active. Managing pain, stiffness, and swelling  If directed, put ice on your foot. To do this: ? Put ice in a plastic bag. ? Place a towel between your skin and the bag. ? Leave the ice on for 20 minutes, 2-3 times a day. ? Remove the ice if your skin turns bright red. This is very important. If you cannot feel pain, heat, or cold, you have a greater risk of damage to the area.  Move your toes often to reduce stiffness and swelling.  When possible, raise (elevate) your foot above the level of your heart while you are sitting or lying down. General instructions  Take over-the-counter and prescription medicines only as told by your health care provider.  Wear supportive shoes that fit well. Wear splints, inserts, or orthotics as told by your health care provider.  If recommended, work with your health care provider to lose weight. This can relieve pressure on your foot.  Do not use any products that contain nicotine or tobacco, such as   cigarettes, e-cigarettes, and chewing tobacco. These can delay bone healing. If you need help quitting, ask your health care provider.  Keep all follow-up visits. This is important.   Where to find more information  American Academy of Orthopaedic Surgeons: www.orthoinfo.aaos.org Contact a health care provider if:  Your pain does not go away with treatment.  Your pain gets worse. Summary  A heel spur is a bony growth that forms on  the bottom of the heel bone (calcaneus).  Heel spurs often cause inflammation in the plantar fascia, which is the band of tissue that connects the toes to the heel bone. This may cause pain on the bottom of the foot, near the heel.  Doing stretching exercises, losing weight, wearing specific shoes or shoe inserts, wearing splints while you sleep, and taking pain medicine may ease the pain and stiffness.  Other treatment options may include high-intensity sound waves to break up the heel spur, steroid injections, or surgery. This information is not intended to replace advice given to you by your health care provider. Make sure you discuss any questions you have with your health care provider. Document Revised: 02/22/2020 Document Reviewed: 02/22/2020 Elsevier Patient Education  2021 Elsevier Inc.  

## 2021-01-18 ENCOUNTER — Ambulatory Visit (INDEPENDENT_AMBULATORY_CARE_PROVIDER_SITE_OTHER): Payer: Medicare Other | Admitting: Family Medicine

## 2021-01-18 ENCOUNTER — Other Ambulatory Visit: Payer: Self-pay

## 2021-01-18 ENCOUNTER — Encounter: Payer: Self-pay | Admitting: Family Medicine

## 2021-01-18 VITALS — BP 135/54 | HR 63 | Temp 98.2°F | Ht 67.0 in | Wt 308.4 lb

## 2021-01-18 DIAGNOSIS — Z1211 Encounter for screening for malignant neoplasm of colon: Secondary | ICD-10-CM

## 2021-01-18 DIAGNOSIS — E782 Mixed hyperlipidemia: Secondary | ICD-10-CM

## 2021-01-18 DIAGNOSIS — R7303 Prediabetes: Secondary | ICD-10-CM | POA: Insufficient documentation

## 2021-01-18 DIAGNOSIS — E559 Vitamin D deficiency, unspecified: Secondary | ICD-10-CM | POA: Diagnosis not present

## 2021-01-18 DIAGNOSIS — Z125 Encounter for screening for malignant neoplasm of prostate: Secondary | ICD-10-CM

## 2021-01-18 DIAGNOSIS — Z86010 Personal history of colonic polyps: Secondary | ICD-10-CM

## 2021-01-18 DIAGNOSIS — Z8639 Personal history of other endocrine, nutritional and metabolic disease: Secondary | ICD-10-CM | POA: Insufficient documentation

## 2021-01-18 LAB — BAYER DCA HB A1C WAIVED: HB A1C (BAYER DCA - WAIVED): 6.1 % (ref <7.0)

## 2021-01-18 MED ORDER — SIMVASTATIN 40 MG PO TABS
40.0000 mg | ORAL_TABLET | Freq: Every day | ORAL | 1 refills | Status: DC
Start: 2021-01-18 — End: 2021-06-05

## 2021-01-18 NOTE — Progress Notes (Signed)
Assessment & Plan:  1. Mixed hyperlipidemia Well controlled on current regimen.  - simvastatin (ZOCOR) 40 MG tablet; Take 1 tablet (40 mg total) by mouth daily.  Dispense: 90 tablet; Refill: 1 - CMP14+EGFR - Lipid panel  2. Morbid obesity ([place]) Encouraged diet and exercise. - CBC with Differential/Platelet - CMP14+EGFR - Lipid panel  3. Vitamin D deficiency Labs to assess. - VITAMIN D 25 Hydroxy (Vit-D Deficiency, Fractures)  4. Prediabetes Labs to assess. - Bayer DCA Hb A1c Waived  5. Screening for prostate cancer - PR PSA, TOTAL SCREENING  6. Colon cancer screening - Ambulatory referral to Gastroenterology  7. History of colon polyps - Ambulatory referral to Gastroenterology   Return in about 6 months (around 07/21/2021) for follow-up of chronic medication conditions.  Hendricks Limes, MSN, APRN, FNP-C [facility]  Subjective:    Patient ID: Brian Steele, male    DOB: 1961/09/14, 60 y.o.   MRN: [id number]  Patient Care Team: Loman Brooklyn, FNP as PCP - General (Family Medicine)   Chief Complaint:  Chief Complaint  Patient presents with  . Hyperlipidemia    Check up of chronic medical conditions     HPI: Brian Steele is a 60 y.o. male presenting on 01/18/2021 for Hyperlipidemia (Check up of chronic medical conditions )  Patient is here to get lab work and a refill of his Simvastatin.  New complaints: None  Social history:  Relevant past medical, surgical, family and social history reviewed and updated as indicated. Interim medical history since our last visit reviewed.  Allergies and medications reviewed and updated.  DATA REVIEWED: CHART IN EPIC  ROS: Negative unless specifically indicated above in HPI.    Current Outpatient Medications:  .  aspirin 81 MG EC tablet, Take 1 tablet by mouth daily., Disp: , Rfl:  .  diclofenac (VOLTAREN) 75 MG EC tablet, Take 1 tablet (75 mg total) by mouth 2 (two) times daily., Disp: 30  tablet, Rfl: 0 .  fluticasone (FLONASE) 50 MCG/ACT nasal spray, Place 1 spray into both nostrils daily as needed., Disp: 15.8 mL, Rfl: 2 .  HYDROcodone-acetaminophen (NORCO/VICODIN) 5-325 MG tablet, Take 1 tablet by mouth 2 (two) times daily., Disp: , Rfl:  .  simvastatin (ZOCOR) 40 MG tablet, Take 1 tablet by mouth daily., Disp: , Rfl:    Allergies  Allergen Reactions  . Bee Venom Swelling  . Other Hives    In laundry detergents  In laundry detergents    . Sodium Hypochlorite Rash   Past Medical History:  Diagnosis Date  . Allergy   . Arthritis   . Barrett's esophagus   . Hyperlipidemia   . Obstructive sleep apnea   . Prediabetes   . Vitamin D deficiency     Past Surgical History:  Procedure Laterality Date  . APPENDECTOMY    . CARPAL TUNNEL RELEASE    . CHOLECYSTECTOMY  2013  . KNEE ARTHROSCOPY     meniscal repair  . NECK SURGERY     has plates  . 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 SHOULDER SURGERY Bilateral     Social History   Socioeconomic History  . Marital status: Married    Spouse name: Not on file  . Number of children: Not on file  . Years of education: Not on file  . Highest education level: Not on file  Occupational History  . Not on file  Tobacco Use  . Smoking status: Former Smoker  Quit date: 2018    Years since quitting: 4.1  . Smokeless tobacco: Never Used  Substance and Sexual Activity  . Alcohol use: Not Currently  . Drug use: Never  . Sexual activity: Not on file  Other Topics Concern  . Not on file  Social History Narrative   ** Merged History Encounter **       Social Determinants of Health   Financial Resource Strain: Not on file  Food Insecurity: Not on file  Transportation Needs: Not on file  Physical Activity: Not on file  Stress: Not on file  Social Connections: Not on file  Intimate Partner Violence: Not on file        Objective:    BP (!) 143/79   Pulse 63   Temp 98.2 F (36.8 C) (Temporal)   Ht 5'  7" (1.702 m)   Wt (!) 308 lb 6.4 oz (139.9 kg)   SpO2 97%   BMI 48.30 kg/m   Wt Readings from Last 3 Encounters:  01/18/21 (!) 308 lb 6.4 oz (139.9 kg)  12/26/20 (!) 306 lb 12.8 oz (139.2 kg)  06/22/20 (!) 303 lb 3.2 oz (137.5 kg)    Physical Exam Vitals reviewed.  Constitutional:      General: He is not in acute distress.    Appearance: Normal appearance. He is morbidly obese. He is not ill-appearing, toxic-appearing or diaphoretic.  HENT:     Head: Normocephalic and atraumatic.  Eyes:     General: No scleral icterus.       Right eye: No discharge.        Left eye: No discharge.     Conjunctiva/sclera: Conjunctivae normal.  Cardiovascular:     Rate and Rhythm: Normal rate and regular rhythm.     Heart sounds: Normal heart sounds. No murmur heard. No friction rub. No gallop.   Pulmonary:     Effort: Pulmonary effort is normal. No respiratory distress.     Breath sounds: Normal breath sounds. No stridor. No wheezing, rhonchi or rales.  Musculoskeletal:        General: Normal range of motion.     Cervical back: Normal range of motion.  Skin:    General: Skin is warm and dry.  Neurological:     Mental Status: He is alert and oriented to person, place, and time. Mental status is at baseline.  Psychiatric:        Mood and Affect: Mood normal.        Behavior: Behavior normal.        Thought Content: Thought content normal.        Judgment: Judgment normal.     No results found for: TSH Lab Results  Component Value Date   WBC 12.4 (H) 06/22/2020   HGB 14.8 06/22/2020   HCT 43.9 06/22/2020   MCV 86 06/22/2020   PLT 317 06/22/2020   Lab Results  Component Value Date   NA 142 06/22/2020   K 4.3 06/22/2020   CO2 40 (H) 06/22/2020   GLUCOSE 83 06/22/2020   BUN 10 06/22/2020   CREATININE 1.02 06/22/2020   BILITOT 0.7 06/22/2020   ALKPHOS 135 (H) 06/22/2020   AST 31 06/22/2020   ALT 39 06/22/2020   PROT 7.5 06/22/2020   ALBUMIN 4.5 06/22/2020   CALCIUM 9.1  06/22/2020   Lab Results  Component Value Date   CHOL 169 06/22/2020   Lab Results  Component Value Date   HDL 50 06/22/2020   Lab Results  Component Value Date   LDLCALC 95 06/22/2020   Lab Results  Component Value Date   TRIG 135 06/22/2020   Lab Results  Component Value Date   CHOLHDL 3.4 06/22/2020   No results found for: HGBA1C

## 2021-01-19 ENCOUNTER — Encounter: Payer: Self-pay | Admitting: Family Medicine

## 2021-01-19 LAB — CBC WITH DIFFERENTIAL/PLATELET
Basophils Absolute: 0.1 10*3/uL (ref 0.0–0.2)
Basos: 1 %
EOS (ABSOLUTE): 0.7 10*3/uL — ABNORMAL HIGH (ref 0.0–0.4)
Eos: 7 %
Hematocrit: 42.1 % (ref 37.5–51.0)
Hemoglobin: 14 g/dL (ref 13.0–17.7)
Immature Grans (Abs): 0 10*3/uL (ref 0.0–0.1)
Immature Granulocytes: 0 %
Lymphocytes Absolute: 2.8 10*3/uL (ref 0.7–3.1)
Lymphs: 27 %
MCH: 28.2 pg (ref 26.6–33.0)
MCHC: 33.3 g/dL (ref 31.5–35.7)
MCV: 85 fL (ref 79–97)
Monocytes Absolute: 0.7 10*3/uL (ref 0.1–0.9)
Monocytes: 7 %
Neutrophils Absolute: 6.1 10*3/uL (ref 1.4–7.0)
Neutrophils: 58 %
Platelets: 253 10*3/uL (ref 150–450)
RBC: 4.96 x10E6/uL (ref 4.14–5.80)
RDW: 13.7 % (ref 11.6–15.4)
WBC: 10.3 10*3/uL (ref 3.4–10.8)

## 2021-01-19 LAB — CMP14+EGFR
ALT: 28 IU/L (ref 0–44)
AST: 20 IU/L (ref 0–40)
Albumin/Globulin Ratio: 1.5 (ref 1.2–2.2)
Albumin: 4.2 g/dL (ref 3.8–4.9)
Alkaline Phosphatase: 107 IU/L (ref 44–121)
BUN/Creatinine Ratio: 12 (ref 9–20)
BUN: 13 mg/dL (ref 6–24)
Bilirubin Total: 0.5 mg/dL (ref 0.0–1.2)
CO2: 20 mmol/L (ref 20–29)
Calcium: 9.1 mg/dL (ref 8.7–10.2)
Chloride: 104 mmol/L (ref 96–106)
Creatinine, Ser: 1.09 mg/dL (ref 0.76–1.27)
Globulin, Total: 2.8 g/dL (ref 1.5–4.5)
Glucose: 91 mg/dL (ref 65–99)
Potassium: 4.4 mmol/L (ref 3.5–5.2)
Sodium: 141 mmol/L (ref 134–144)
Total Protein: 7 g/dL (ref 6.0–8.5)
eGFR: 78 mL/min/{1.73_m2} (ref >59)

## 2021-01-19 LAB — LIPID PANEL
Chol/HDL Ratio: 3.8 ratio (ref 0.0–5.0)
Cholesterol, Total: 173 mg/dL (ref 100–199)
HDL: 46 mg/dL (ref >39)
LDL Chol Calc (NIH): 96 mg/dL (ref 0–99)
Triglycerides: 177 mg/dL — ABNORMAL HIGH (ref 0–149)
VLDL Cholesterol Cal: 31 mg/dL (ref 5–40)

## 2021-01-19 LAB — VITAMIN D 25 HYDROXY (VIT D DEFICIENCY, FRACTURES): Vit D, 25-Hydroxy: 34.1 ng/mL (ref 30.0–100.0)

## 2021-01-21 LAB — PSA, TOTAL AND FREE
PSA, Free Pct: 37.5 %
PSA, Free: 0.3 ng/mL
Prostate Specific Ag, Serum: 0.8 ng/mL (ref 0.0–4.0)

## 2021-01-21 LAB — SPECIMEN STATUS REPORT

## 2021-03-13 ENCOUNTER — Ambulatory Visit (INDEPENDENT_AMBULATORY_CARE_PROVIDER_SITE_OTHER): Payer: Medicare Other | Admitting: Nurse Practitioner

## 2021-03-13 ENCOUNTER — Ambulatory Visit: Payer: Medicare Other | Admitting: Family Medicine

## 2021-03-13 ENCOUNTER — Encounter: Payer: Self-pay | Admitting: Nurse Practitioner

## 2021-03-13 VITALS — BP 142/82 | HR 88 | Temp 98.2°F | Ht 67.0 in | Wt 297.0 lb

## 2021-03-13 DIAGNOSIS — J40 Bronchitis, not specified as acute or chronic: Secondary | ICD-10-CM

## 2021-03-13 MED ORDER — BENZONATATE 100 MG PO CAPS: 100.0000 mg | ORAL_CAPSULE | Freq: Three times a day (TID) | ORAL | 0 refills | Status: DC | PRN

## 2021-03-13 MED ORDER — ALBUTEROL SULFATE HFA 108 (90 BASE) MCG/ACT IN AERS: 2.0000 | INHALATION_SPRAY | Freq: Four times a day (QID) | RESPIRATORY_TRACT | 0 refills | Status: DC | PRN

## 2021-03-13 MED ORDER — PREDNISONE 20 MG PO TABS: 40.0000 mg | ORAL_TABLET | Freq: Every day | ORAL | 0 refills | Status: AC

## 2021-03-13 NOTE — Patient Instructions (Signed)

## 2021-03-13 NOTE — Progress Notes (Signed)
Subjective:    Patient ID: Brian Steele, male    DOB: 09-02-1961, 60 y.o.   MRN: [id number]   Chief Complaint: Sore Throat (Cough/) and Cough   HPI Patient comes in c/o cough and congestion. Started out as sore throat 2 days ago and now he has a cough. Taking dayquil and nyquil with slight relief.   Review of Systems  Constitutional: Negative for chills and fever.  HENT: Positive for congestion, ear pain, rhinorrhea and sore throat.   Respiratory: Positive for cough and shortness of breath (slight).   Musculoskeletal: Negative for myalgias.  Neurological: Positive for dizziness and headaches.       Objective:   Physical Exam Vitals and nursing note reviewed.  Constitutional:      Appearance: He is well-developed. He is obese.  HENT:     Right Ear: Tympanic membrane normal.     Left Ear: Tympanic membrane normal.     Nose: Congestion and rhinorrhea present.     Mouth/Throat:     Mouth: Mucous membranes are moist. No oral lesions.     Pharynx: No oropharyngeal exudate.  Cardiovascular:     Rate and Rhythm: Normal rate and regular rhythm.  Pulmonary:     Effort: Pulmonary effort is normal.     Breath sounds: Normal breath sounds. No wheezing.  Abdominal:     General: Bowel sounds are normal.  Skin:    General: Skin is warm.  Neurological:     General: No focal deficit present.     Mental Status: He is alert and oriented to person, place, and time.    BP (!) 142/82   Pulse 88   Temp 98.2 F (36.8 C) (Skin)   Ht 5\' 7"  (1.702 m)   Wt 297 lb (134.7 kg)   BMI 46.52 kg/m         Assessment & Plan:  Brian Steele in today with chief complaint of Sore Throat (Cough/) and Cough   1. Bronchitis 1. Take meds as prescribed 2. Use a cool mist humidifier especially during the winter months and when heat has been humid. 3. Use saline nose sprays frequently 4. Saline irrigations of the nose can be very helpful if done frequently.  * 4X daily for 1 week*  * Use of  a nettie pot can be helpful with this. Follow directions with this* 5. Drink plenty of fluids 6. Keep thermostat turn down low 7.For any cough or congestion  Tessalon perles as prescribed 8. For fever or aces or pains- take tylenol or ibuprofen appropriate for age and weight.  * for fevers greater than 101 orally you may alternate ibuprofen and tylenol every  3 hours.    - benzonatate (TESSALON PERLES) 100 MG capsule; Take 1 capsule (100 mg total) by mouth 3 (three) times daily as needed.  Dispense: 20 capsule; Refill: 0 - predniSONE (DELTASONE) 20 MG tablet; Take 2 tablets (40 mg total) by mouth daily with breakfast for 5 days. 2 po daily for 5 days  Dispense: 10 tablet; Refill: 0 - albuterol (VENTOLIN HFA) 108 (90 Base) MCG/ACT inhaler; Inhale 2 puffs into the lungs every 6 (six) hours as needed for wheezing or shortness of breath.  Dispense: 8 g; Refill: 0    The above assessment and management plan was discussed with the patient. The patient verbalized understanding of and has agreed to the management plan. Patient is aware to call the clinic if symptoms persist or worsen. Patient is aware when  to return to the clinic for a follow-up visit. Patient educated on when it is appropriate to go to the emergency department.   Mary-Margaret Hassell Done, FNP

## 2021-03-16 ENCOUNTER — Other Ambulatory Visit (HOSPITAL_BASED_OUTPATIENT_CLINIC_OR_DEPARTMENT_OTHER): Payer: Self-pay

## 2021-03-18 ENCOUNTER — Other Ambulatory Visit: Payer: Self-pay | Admitting: Family

## 2021-04-05 ENCOUNTER — Other Ambulatory Visit: Payer: Self-pay | Admitting: Nurse Practitioner

## 2021-04-05 DIAGNOSIS — J40 Bronchitis, not specified as acute or chronic: Secondary | ICD-10-CM

## 2021-05-10 ENCOUNTER — Other Ambulatory Visit: Payer: Self-pay | Admitting: Family Medicine

## 2021-05-10 DIAGNOSIS — J40 Bronchitis, not specified as acute or chronic: Secondary | ICD-10-CM

## 2021-06-05 ENCOUNTER — Other Ambulatory Visit: Payer: Self-pay | Admitting: Family Medicine

## 2021-06-05 DIAGNOSIS — E782 Mixed hyperlipidemia: Secondary | ICD-10-CM

## 2021-06-07 ENCOUNTER — Other Ambulatory Visit: Payer: Self-pay | Admitting: Family

## 2021-07-05 ENCOUNTER — Other Ambulatory Visit: Payer: Self-pay | Admitting: Family

## 2021-07-12 ENCOUNTER — Other Ambulatory Visit: Payer: Self-pay | Admitting: Family

## 2021-07-24 ENCOUNTER — Other Ambulatory Visit: Payer: Self-pay

## 2021-07-24 ENCOUNTER — Other Ambulatory Visit: Payer: Self-pay | Admitting: Otolaryngology

## 2021-07-24 ENCOUNTER — Other Ambulatory Visit (HOSPITAL_COMMUNITY): Payer: Self-pay | Admitting: Otolaryngology

## 2021-07-24 ENCOUNTER — Encounter: Payer: Self-pay | Admitting: Family Medicine

## 2021-07-24 ENCOUNTER — Ambulatory Visit (INDEPENDENT_AMBULATORY_CARE_PROVIDER_SITE_OTHER): Payer: Medicare Other | Admitting: Family Medicine

## 2021-07-24 VITALS — BP 120/72 | HR 64 | Temp 98.1°F | Ht 67.0 in | Wt 290.0 lb

## 2021-07-24 DIAGNOSIS — R7303 Prediabetes: Secondary | ICD-10-CM | POA: Diagnosis not present

## 2021-07-24 DIAGNOSIS — E782 Mixed hyperlipidemia: Secondary | ICD-10-CM | POA: Diagnosis not present

## 2021-07-24 DIAGNOSIS — R1310 Dysphagia, unspecified: Secondary | ICD-10-CM

## 2021-07-24 LAB — BAYER DCA HB A1C WAIVED: HB A1C (BAYER DCA - WAIVED): 5.5 % (ref 4.8–5.6)

## 2021-07-24 MED ORDER — SIMVASTATIN 40 MG PO TABS: 40.0000 mg | ORAL_TABLET | Freq: Every day | ORAL | 1 refills | Status: DC

## 2021-07-24 NOTE — Progress Notes (Signed)
Assessment & Plan:  1. Mixed hyperlipidemia Well controlled on current regimen.  - Lipid panel - CBC with Differential/Platelet - CMP14+EGFR - simvastatin (ZOCOR) 40 MG tablet; Take 1 tablet (40 mg total) by mouth daily.  Dispense: 90 tablet; Refill: 1  2. Prediabetes No longer in prediabetic range with dietary changes. - Bayer DCA Hb A1c Waived  3. Morbid obesity ([place]) Continue healthy eating.  Encouraged exercise.   Follow-up: Return in about 6 months (around 01/21/2022) for follow-up of chronic medication conditions.   Hendricks Limes, MSN, APRN, FNP-C [facility]  Subjective:  Patient ID: Brian Steele, male    DOB: 28-May-1961  Age: 60 y.o. MRN: [id number]  Patient Care Team: Loman Brooklyn, FNP as PCP - General (Family Medicine)   CC:  Chief Complaint  Patient presents with   Hyperlipidemia   Prediabetes    6 month follow up of chronic medical conditions     HPI Brian Steele presents for a follow-up of chronic medical conditions.  Occupation: Retired/disabled, Marital status: Married, Substance use: None Diet: He has decreased sodas, pasta, and sweets after his wife was diagnosed as borderline diabetic, Exercise: None Last eye exam: Does not go Last dental exam: Does not go Last colonoscopy: This past year at [facility] Hepatitis C Screening: Declined PSA: 01/18/2021 Immunizations: Flu Vaccine: declined Tdap Vaccine: declined  Shingrix Vaccine: declined  COVID-19 Vaccine: up to date Pneumonia Vaccine: declined  DEPRESSION SCREENING PHQ 2/9 Scores 07/24/2021 03/13/2021 01/18/2021 12/26/2020 06/22/2020  PHQ - 2 Score 0 0 0 0 0  PHQ- 9 Score 0 - - - 0    Patient reports he has a barium swallow appointment coming up.  Review of Systems  Constitutional:  Negative for chills, fever, malaise/fatigue and weight loss.  HENT:  Negative for congestion, ear discharge, ear pain, nosebleeds, sinus pain, sore throat and tinnitus.   Eyes:  Negative for  blurred vision, double vision, pain, discharge and redness.  Respiratory:  Negative for cough, shortness of breath and wheezing.   Cardiovascular:  Negative for chest pain, palpitations and leg swelling.  Gastrointestinal:  Positive for heartburn. Negative for abdominal pain, constipation, diarrhea, nausea and vomiting.  Genitourinary:  Negative for dysuria, frequency and urgency.  Musculoskeletal:  Negative for myalgias.  Skin:  Negative for rash.  Neurological:  Negative for dizziness, seizures, weakness and headaches.  Psychiatric/Behavioral:  Negative for depression, substance abuse and suicidal ideas. The patient is not nervous/anxious.     Current Outpatient Medications:    aspirin 81 MG EC tablet, Take 1 tablet by mouth daily., Disp: , Rfl:    HYDROcodone-acetaminophen (NORCO/VICODIN) 5-325 MG tablet, Take 1 tablet by mouth 2 (two) times daily., Disp: , Rfl:    simvastatin (ZOCOR) 40 MG tablet, TAKE 1 TABLET BY MOUTH EVERY DAY, Disp: 90 tablet, Rfl: 0  Allergies  Allergen Reactions   Bee Venom Swelling   Other Hives    In laundry detergents  In laundry detergents     Sodium Hypochlorite Rash    Past Medical History:  Diagnosis Date   Allergy    Arthritis    Barrett's esophagus    Hyperlipidemia    Obstructive sleep apnea    Prediabetes    Vitamin D deficiency     Past Surgical History:  Procedure Laterality Date   APPENDECTOMY     CARPAL TUNNEL RELEASE     CHOLECYSTECTOMY  2013   KNEE ARTHROSCOPY     meniscal repair   NECK  SURGERY     has plates   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   SHOULDER SURGERY Bilateral     Family History  Problem Relation Age of Onset   Hypertension Brother    Hyperlipidemia Brother     Social History   Socioeconomic History   Marital status: Married    Spouse name: Not on file   Number of children: Not on file   Years of education: Not on file   Highest education level: Not on file  Occupational History   Not on  file  Tobacco Use   Smoking status: Former    Types: Cigarettes    Quit date: 2018    Years since quitting: 4.7   Smokeless tobacco: Never  Substance and Sexual Activity   Alcohol use: Not Currently   Drug use: Never   Sexual activity: Not on file  Other Topics Concern   Not on file  Social History Narrative   ** Merged History Encounter **       Social Determinants of Health   Financial Resource Strain: Not on file  Food Insecurity: Not on file  Transportation Needs: Not on file  Physical Activity: Not on file  Stress: Not on file  Social Connections: Not on file  Intimate Partner Violence: Not on file      Objective:    BP 120/72   Pulse 64   Temp 98.1 F (36.7 C) (Temporal)   Ht _0  (1.702 m)   Wt 290 lb (131.5 kg)   SpO2 92%   BMI 45.42 kg/m   Wt Readings from Last 3 Encounters:  07/24/21 290 lb (131.5 kg)  03/13/21 297 lb (134.7 kg)  01/18/21 (!) 308 lb 6.4 oz (139.9 kg)    Physical Exam Vitals reviewed.  Constitutional:      General: He is not in acute distress.    Appearance: Normal appearance. He is morbidly obese. He is not ill-appearing, toxic-appearing or diaphoretic.  HENT:     Head: Normocephalic and atraumatic.     Right Ear: Tympanic membrane, ear canal and external ear normal. There is no impacted cerumen.     Left Ear: Tympanic membrane, ear canal and external ear normal. There is no impacted cerumen.     Nose: Nose normal. No congestion or rhinorrhea.     Mouth/Throat:     Mouth: Mucous membranes are moist.     Pharynx: Oropharynx is clear. No oropharyngeal exudate or posterior oropharyngeal erythema.  Eyes:     General: No scleral icterus.       Right eye: No discharge.        Left eye: No discharge.     Conjunctiva/sclera: Conjunctivae normal.     Pupils: Pupils are equal, round, and reactive to light.  Neck:     Vascular: No carotid bruit.  Cardiovascular:     Rate and Rhythm: Normal rate and regular rhythm.     Heart  sounds: Normal heart sounds. No murmur heard.   No friction rub. No gallop.  Pulmonary:     Effort: Pulmonary effort is normal. No respiratory distress.     Breath sounds: Normal breath sounds. No stridor. No wheezing, rhonchi or rales.  Abdominal:     General: Abdomen is flat. Bowel sounds are normal. There is no distension.     Palpations: Abdomen is soft. There is no hepatomegaly, splenomegaly or mass.     Tenderness: There is no abdominal tenderness. There is no guarding or rebound.  Hernia: No hernia is present.  Musculoskeletal:        General: Normal range of motion.     Cervical back: Normal range of motion and neck supple. No rigidity. No muscular tenderness.     Right lower leg: No edema.     Left lower leg: No edema.  Lymphadenopathy:     Cervical: No cervical adenopathy.  Skin:    General: Skin is warm and dry.     Capillary Refill: Capillary refill takes less than 2 seconds.  Neurological:     General: No focal deficit present.     Mental Status: He is alert and oriented to person, place, and time. Mental status is at baseline.  Psychiatric:        Mood and Affect: Mood normal.        Behavior: Behavior normal.        Thought Content: Thought content normal.        Judgment: Judgment normal.    No results found for: TSH Lab Results  Component Value Date   WBC 10.3 01/18/2021   HGB 14.0 01/18/2021   HCT 42.1 01/18/2021   MCV 85 01/18/2021   PLT 253 01/18/2021   Lab Results  Component Value Date   NA 141 01/18/2021   K 4.4 01/18/2021   CO2 20 01/18/2021   GLUCOSE 91 01/18/2021   BUN 13 01/18/2021   CREATININE 1.09 01/18/2021   BILITOT 0.5 01/18/2021   ALKPHOS 107 01/18/2021   AST 20 01/18/2021   ALT 28 01/18/2021   PROT 7.0 01/18/2021   ALBUMIN 4.2 01/18/2021   CALCIUM 9.1 01/18/2021   EGFR 78 01/18/2021   Lab Results  Component Value Date   CHOL 173 01/18/2021   Lab Results  Component Value Date   HDL 46 01/18/2021   Lab Results   Component Value Date   LDLCALC 96 01/18/2021   Lab Results  Component Value Date   TRIG 177 (H) 01/18/2021   Lab Results  Component Value Date   CHOLHDL 3.8 01/18/2021   Lab Results  Component Value Date   HGBA1C 6.1 01/18/2021

## 2021-07-25 LAB — CBC WITH DIFFERENTIAL/PLATELET
Basophils Absolute: 0.1 10*3/uL (ref 0.0–0.2)
Basos: 1 %
EOS (ABSOLUTE): 0.6 10*3/uL — ABNORMAL HIGH (ref 0.0–0.4)
Eos: 6 %
Hematocrit: 45.1 % (ref 37.5–51.0)
Hemoglobin: 15 g/dL (ref 13.0–17.7)
Immature Grans (Abs): 0 10*3/uL (ref 0.0–0.1)
Immature Granulocytes: 0 %
Lymphocytes Absolute: 3.2 10*3/uL — ABNORMAL HIGH (ref 0.7–3.1)
Lymphs: 33 %
MCH: 28.7 pg (ref 26.6–33.0)
MCHC: 33.3 g/dL (ref 31.5–35.7)
MCV: 86 fL (ref 79–97)
Monocytes Absolute: 0.8 10*3/uL (ref 0.1–0.9)
Monocytes: 8 %
Neutrophils Absolute: 5.2 10*3/uL (ref 1.4–7.0)
Neutrophils: 52 %
Platelets: 290 10*3/uL (ref 150–450)
RBC: 5.23 x10E6/uL (ref 4.14–5.80)
RDW: 14 % (ref 11.6–15.4)
WBC: 9.9 10*3/uL (ref 3.4–10.8)

## 2021-07-25 LAB — CMP14+EGFR
ALT: 29 IU/L (ref 0–44)
AST: 26 IU/L (ref 0–40)
Albumin/Globulin Ratio: 1.7 (ref 1.2–2.2)
Albumin: 4.7 g/dL (ref 3.8–4.9)
Alkaline Phosphatase: 121 IU/L (ref 44–121)
BUN/Creatinine Ratio: 13 (ref 10–24)
BUN: 13 mg/dL (ref 8–27)
Bilirubin Total: 1 mg/dL (ref 0.0–1.2)
CO2: 23 mmol/L (ref 20–29)
Calcium: 9.2 mg/dL (ref 8.6–10.2)
Chloride: 102 mmol/L (ref 96–106)
Creatinine, Ser: 1.03 mg/dL (ref 0.76–1.27)
Globulin, Total: 2.8 g/dL (ref 1.5–4.5)
Glucose: 85 mg/dL (ref 65–99)
Potassium: 4.5 mmol/L (ref 3.5–5.2)
Sodium: 141 mmol/L (ref 134–144)
Total Protein: 7.5 g/dL (ref 6.0–8.5)
eGFR: 83 mL/min/{1.73_m2} (ref >59)

## 2021-07-25 LAB — LIPID PANEL
Chol/HDL Ratio: 4.1 ratio (ref 0.0–5.0)
Cholesterol, Total: 164 mg/dL (ref 100–199)
HDL: 40 mg/dL (ref >39)
LDL Chol Calc (NIH): 97 mg/dL (ref 0–99)
Triglycerides: 153 mg/dL — ABNORMAL HIGH (ref 0–149)
VLDL Cholesterol Cal: 27 mg/dL (ref 5–40)

## 2021-08-01 ENCOUNTER — Ambulatory Visit (HOSPITAL_COMMUNITY)
Admission: RE | Admit: 2021-08-01 | Discharge: 2021-08-01 | Disposition: A | Discharge status: Home / Self Care | Payer: Medicare Other | Source: Ambulatory Visit | Attending: Otolaryngology | Admitting: Otolaryngology

## 2021-08-01 ENCOUNTER — Other Ambulatory Visit: Payer: Self-pay

## 2021-08-01 DIAGNOSIS — R1310 Dysphagia, unspecified: Secondary | ICD-10-CM | POA: Insufficient documentation

## 2021-09-10 ENCOUNTER — Encounter: Payer: Self-pay | Admitting: Nurse Practitioner

## 2021-09-10 ENCOUNTER — Other Ambulatory Visit: Payer: Self-pay

## 2021-09-10 ENCOUNTER — Ambulatory Visit (INDEPENDENT_AMBULATORY_CARE_PROVIDER_SITE_OTHER): Payer: Medicare Other | Admitting: Nurse Practitioner

## 2021-09-10 VITALS — BP 134/74 | HR 79 | Temp 98.1°F | Resp 20 | Ht 67.0 in | Wt 288.0 lb

## 2021-09-10 DIAGNOSIS — H1032 Unspecified acute conjunctivitis, left eye: Secondary | ICD-10-CM | POA: Insufficient documentation

## 2021-09-10 MED ORDER — BACITRACIN-POLYMYXIN B 500-10000 UNIT/GM OP OINT: 1.0000 "application " | TOPICAL_OINTMENT | Freq: Two times a day (BID) | OPHTHALMIC | 0 refills | Status: DC

## 2021-09-10 NOTE — Patient Instructions (Signed)
Bacterial Conjunctivitis, Adult Bacterial conjunctivitis is an infection of your conjunctiva. This is the clear membrane that covers the white part of your eye and the inner part of your eyelid. This infection can make your eye: Red or pink. Itchy or irritated. This condition spreads easily from person to person (is contagious) and from one eye to the other eye. What are the causes? This condition is caused by germs (bacteria). You may get the infection if you come into close contact with: A person who has the infection. Items that have germs on them (are contaminated), such as face towels, contact lens solution, or eye makeup. What increases the risk? You are more likely to get this condition if: You have contact with people who have the infection. You wear contact lenses. You have a sinus infection. You have had a recent eye injury or surgery. You have a weak body defense system (immune system). You have dry eyes. What are the signs or symptoms?  Thick, yellowish discharge from the eye. Tearing or watery eyes. Itchy eyes. Burning feeling in your eyes. Eye redness. Swollen eyelids. Blurred vision. How is this treated?  Antibiotic eye drops or ointment. Antibiotic medicine taken by mouth. This is used for infections that do not get better with drops or ointment or that last more than 10 days. Cool, wet cloths placed on the eyes. Artificial tears used 2-6 times a day. Follow these instructions at home: Medicines Take or apply your antibiotic medicine as told by your doctor. Do not stop using it even if you start to feel better. Take or apply over-the-counter and prescription medicines only as told by your doctor. Do not touch your eyelid with the eye-drop bottle or the ointment tube. Managing discomfort Wipe any fluid from your eye with a warm, wet washcloth or a cotton ball. Place a clean, cool, wet cloth on your eye. Do this for 10-20 minutes, 3-4 times a day. General  instructions Do not wear contacts until the infection is gone. Wear glasses until your doctor says it is okay to wear contacts again. Do not wear eye makeup until the infection is gone. Throw away old eye makeup. Change or wash your pillowcase every day. Do not share towels or washcloths. Wash your hands often with soap and water for at least 20 seconds and especially before touching your face or eyes. Use paper towels to dry your hands. Do not touch or rub your eyes. Do not drive or use heavy machinery if your vision is blurred. Contact a doctor if: You have a fever. You do not get better after 10 days. Get help right away if: You have a fever and your symptoms get worse all of a sudden. You have very bad pain when you move your eye. Your face: Hurts. Is red. Is swollen. You have sudden loss of vision. Summary Bacterial conjunctivitis is an infection of your conjunctiva. This infection spreads easily from person to person. Wash your hands often with soap and water for at least 20 seconds and especially before touching your face or eyes. Use paper towels to dry your hands. Take or apply your antibiotic medicine as told by your doctor. Contact a doctor if you have a fever or you do not get better after 10 days. This information is not intended to replace advice given to you by your health care provider. Make sure you discuss any questions you have with your health care provider. Document Revised: 02/07/2021 Document Reviewed: 02/07/2021 Elsevier Patient Education    2022 Elsevier Inc.  

## 2021-09-10 NOTE — Assessment & Plan Note (Signed)
Symptoms started after patient completed mowing his lawn.  In the past few weeks symptoms are not well controlled with worsening redness, swelling, eye discharge and pain.  Patient denies blurry vision, headache, and feelings of foreign object in.  Provided education to patient to walk into an eye clinic if symptoms worsens.  Started patient on Polysporin ophthalmic ointment to treat conjunctivitis.  Encourage patient to wash hands before touching eyes and use warm compress as needed.  Patient verbalized understanding.  Rx sent to pharmacy.  Follow-up with unresolved symptoms.

## 2021-09-10 NOTE — Progress Notes (Signed)
Acute Office Visit  Subjective:    Patient ID: Brian Steele, male    DOB: May 24, 1961, 60 y.o.   MRN: [id number]  Chief Complaint  Patient presents with   Left eye red, watering and itchy    Conjunctivitis  The current episode started 2 days ago. The onset was gradual. The problem occurs rarely. The problem has been gradually worsening. Nothing relieves the symptoms. Nothing aggravates the symptoms. Associated symptoms include eye itching, ear discharge, eye discharge, eye pain and eye redness. Pertinent negatives include no fever, no decreased vision, no URI and no rash. The eye pain is severe.    Past Medical History:  Diagnosis Date   Allergy    Arthritis    Barrett's esophagus    Hyperlipidemia    Obstructive sleep apnea    Prediabetes    Vitamin D deficiency     Past Surgical History:  Procedure Laterality Date   APPENDECTOMY     CARPAL TUNNEL RELEASE     CHOLECYSTECTOMY  2013   KNEE ARTHROSCOPY     meniscal repair   NECK SURGERY     has plates   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   SHOULDER SURGERY Bilateral     Family History  Problem Relation Age of Onset   Hypertension Brother    Hyperlipidemia Brother     Social History   Socioeconomic History   Marital status: Married    Spouse name: Not on file   Number of children: Not on file   Years of education: Not on file   Highest education level: Not on file  Occupational History   Not on file  Tobacco Use   Smoking status: Former    Types: Cigarettes    Quit date: 2018    Years since quitting: 4.8   Smokeless tobacco: Never  Substance and Sexual Activity   Alcohol use: Not Currently   Drug use: Never   Sexual activity: Not on file  Other Topics Concern   Not on file  Social History Narrative   ** Merged History Encounter **       Social Determinants of Health   Financial Resource Strain: Not on file  Food Insecurity: Not on file  Transportation Needs: Not on file  Physical  Activity: Not on file  Stress: Not on file  Social Connections: Not on file  Intimate Partner Violence: Not on file    Outpatient Medications Prior to Visit  Medication Sig Dispense Refill   aspirin 81 MG EC tablet Take 1 tablet by mouth daily.     HYDROcodone-acetaminophen (NORCO/VICODIN) 5-325 MG tablet Take 1 tablet by mouth 2 (two) times daily.     simvastatin (ZOCOR) 40 MG tablet Take 1 tablet (40 mg total) by mouth daily. 90 tablet 1   No facility-administered medications prior to visit.    Allergies  Allergen Reactions   Bee Venom Swelling   Other Hives    In laundry detergents  In laundry detergents     Sodium Hypochlorite Rash    Review of Systems  Constitutional:  Negative for fever.  HENT:  Positive for ear discharge.   Eyes:  Positive for pain, discharge, redness and itching.  Gastrointestinal: Negative.   Skin:  Negative for rash.  All other systems reviewed and are negative.     Objective:    Physical Exam Vitals and nursing note reviewed.  Constitutional:      Appearance: Normal appearance.  HENT:     Head: Normocephalic.  Right Ear: Ear canal and external ear normal.     Left Ear: Ear canal and external ear normal.     Nose: Nose normal. No congestion.     Mouth/Throat:     Mouth: Mucous membranes are moist.     Pharynx: Oropharynx is clear.  Eyes:     General: Scleral icterus present. No visual field deficit.    Conjunctiva/sclera: Conjunctivae normal.  Cardiovascular:     Rate and Rhythm: Normal rate and regular rhythm.  Abdominal:     General: Bowel sounds are normal.  Skin:    General: Skin is warm.     Findings: No rash.  Neurological:     Mental Status: He is alert and oriented to person, place, and time.    BP 134/74   Pulse 79   Temp 98.1 F (36.7 C) (Temporal)   Resp 20   Ht _0  (1.702 m)   Wt 288 lb (130.6 kg)   SpO2 97%   BMI 45.11 kg/m  Wt Readings from Last 3 Encounters:  09/10/21 288 lb (130.6 kg)  07/24/21  290 lb (131.5 kg)  03/13/21 297 lb (134.7 kg)    Health Maintenance Due  Topic Date Due   COVID-19 Vaccine (4 - Booster for Moderna series) 01/01/2021    There are no preventive care reminders to display for this patient.   No results found for: TSH Lab Results  Component Value Date   WBC 9.9 07/24/2021   HGB 15.0 07/24/2021   HCT 45.1 07/24/2021   MCV 86 07/24/2021   PLT 290 07/24/2021   Lab Results  Component Value Date   NA 141 07/24/2021   K 4.5 07/24/2021   CO2 23 07/24/2021   GLUCOSE 85 07/24/2021   BUN 13 07/24/2021   CREATININE 1.03 07/24/2021   BILITOT 1.0 07/24/2021   ALKPHOS 121 07/24/2021   AST 26 07/24/2021   ALT 29 07/24/2021   PROT 7.5 07/24/2021   ALBUMIN 4.7 07/24/2021   CALCIUM 9.2 07/24/2021   EGFR 83 07/24/2021   Lab Results  Component Value Date   CHOL 164 07/24/2021   Lab Results  Component Value Date   HDL 40 07/24/2021   Lab Results  Component Value Date   LDLCALC 97 07/24/2021   Lab Results  Component Value Date   TRIG 153 (H) 07/24/2021   Lab Results  Component Value Date   CHOLHDL 4.1 07/24/2021   Lab Results  Component Value Date   HGBA1C 5.5 07/24/2021       Assessment & Plan:   Problem List Items Addressed This Visit       Other   Acute bacterial conjunctivitis of left eye - Primary    Symptoms started after patient completed mowing his lawn.  In the past few weeks symptoms are not well controlled with worsening redness, swelling, eye discharge and pain.  Patient denies blurry vision, headache, and feelings of foreign object in.  Provided education to patient to walk into an eye clinic if symptoms worsens.  Started patient on Polysporin ophthalmic ointment to treat conjunctivitis.  Encourage patient to wash hands before touching eyes and use warm compress as needed.  Patient verbalized understanding.  Rx sent to pharmacy.  Follow-up with unresolved symptoms.      Relevant Medications   bacitracin-polymyxin b  (POLYSPORIN) ophthalmic ointment     Meds ordered this encounter  Medications   bacitracin-polymyxin b (POLYSPORIN) ophthalmic ointment    Sig: Place 1 application into the  left eye every 12 (twelve) hours. apply to eye every 12 hours while awake    Dispense:  3.5 g    Refill:  0    Order Specific Question:   Supervising Provider    Answer:   Claretta Fraise [[id number]]     Ivy Lynn, NP

## 2021-10-02 ENCOUNTER — Ambulatory Visit (INDEPENDENT_AMBULATORY_CARE_PROVIDER_SITE_OTHER): Payer: Medicare Other | Admitting: Family Medicine

## 2021-10-02 ENCOUNTER — Encounter: Payer: Self-pay | Admitting: Family Medicine

## 2021-10-02 DIAGNOSIS — H60502 Unspecified acute noninfective otitis externa, left ear: Secondary | ICD-10-CM | POA: Diagnosis not present

## 2021-10-02 MED ORDER — AMOXICILLIN-POT CLAVULANATE 875-125 MG PO TABS: 1.0000 | ORAL_TABLET | Freq: Two times a day (BID) | ORAL | 0 refills | Status: AC

## 2021-10-02 MED ORDER — OFLOXACIN 0.3 % OT SOLN: 10.0000 [drp] | Freq: Every day | OTIC | 0 refills | Status: AC

## 2021-10-02 NOTE — Progress Notes (Signed)
   Virtual Visit  Note Due to COVID-19 pandemic this visit was conducted virtually. This visit type was conducted due to national recommendations for restrictions regarding the COVID-19 Pandemic (e.g. social distancing, sheltering in place) in an effort to limit this patient's exposure and mitigate transmission in our community. All issues noted in this document were discussed and addressed.  A physical exam was not performed with this format.  I connected with Brian Steele on 10/02/21 at 1445 by telephone and verified that I am speaking with the correct person using two identifiers. Brian Steele is currently located at home and his wife is currently with him during the visit. The provider, Gabriel Earing, FNP is located in their office at time of visit.  I discussed the limitations, risks, security and privacy concerns of performing an evaluation and management service by telephone and the availability of in person appointments. I also discussed with the patient that there may be a patient responsible charge related to this service. The patient expressed understanding and agreed to proceed.  CC: ear pain  History and Present Illness:  HPI [name] reports left ear pain x 3 days. He reports that the ear canal feels swollen. The external ear is tender to the touch. He denies erythema. He also has a headache of the left side around his ear and temple. He has a mild cough. He denies fever, drainage, congestion, changes in hearing or vision. He has taken dayquil and nyquil without improvement.     ROS As per HPI.   Observations/Objective: Alert and oriented x 3. Able to speak in full sentences without difficulty.   Assessment and Plan: Brian Steele was seen today for ear pain.  Diagnoses and all orders for this visit:  Acute otitis externa of left ear, unspecified type Floxin otic sent in. Discussed if no improvement in 2 days or if symptoms worsen to take augmentin. Return to office for new or  worsening symptoms, or if symptoms persist.  -     ofloxacin (FLOXIN OTIC) 0.3 % OTIC solution; Place 10 drops into the left ear daily for 7 days. -     amoxicillin-clavulanate (AUGMENTIN) 875-125 MG tablet; Take 1 tablet by mouth 2 (two) times daily for 7 days.    Follow Up Instructions: As needed.     I discussed the assessment and treatment plan with the patient. The patient was provided an opportunity to ask questions and all were answered. The patient agreed with the plan and demonstrated an understanding of the instructions.   The patient was advised to call back or seek an in-person evaluation if the symptoms worsen or if the condition fails to improve as anticipated.  The above assessment and management plan was discussed with the patient. The patient verbalized understanding of and has agreed to the management plan. Patient is aware to call the clinic if symptoms persist or worsen. Patient is aware when to return to the clinic for a follow-up visit. Patient educated on when it is appropriate to go to the emergency department.   Time call ended:  1457  I provided 12 minutes of  non face-to-face time during this encounter.    Gabriel Earing, FNP

## 2022-01-12 DIAGNOSIS — I700 Atherosclerosis of aorta: Secondary | ICD-10-CM | POA: Insufficient documentation

## 2022-01-12 HISTORY — DX: Atherosclerosis of aorta: I70.0

## 2022-01-14 ENCOUNTER — Telehealth: Payer: Self-pay | Admitting: Family Medicine

## 2022-01-14 NOTE — Telephone Encounter (Signed)
Contacted patient.  Patient will do hospital follow up on the 13th of April with Hendricks Limes. ?His appointment was adjusted to reflect 1/2 hour hospital f/u ?

## 2022-01-15 ENCOUNTER — Telehealth: Payer: Self-pay

## 2022-01-15 NOTE — Telephone Encounter (Signed)
TCM  

## 2022-01-16 NOTE — Telephone Encounter (Signed)
Transition Care Management Follow-up Telephone Call ?Date of discharge and from where: TCM: D/C [facility] 01/14/22 - COPD Exacerbation ?How have you been since you were released from the hospital? Doing ok  ?Any questions or concerns? No ? ?Items Reviewed: ?Did the pt receive and understand the discharge instructions provided? Yes  ?Medications obtained and verified? Yes  ?Other? No  ?Any new allergies since your discharge? No  ?Dietary orders reviewed? Yes ?Do you have support at home? Yes  ? ?Home Care and Equipment/Supplies: ?Were home health services ordered? no ?If so, what is the name of the agency? na ?Has the agency set up a time to come to the patient's home? not applicable ?Were any new equipment or medical supplies ordered?  No ?What is the name of the medical supply agency? na ?Were you able to get the supplies/equipment? not applicable ?Do you have any questions related to the use of the equipment or supplies? No ? ?Functional Questionnaire: (I = Independent and D = Dependent) ?ADLs: I ? ?Bathing/Dressing- I ? ?Meal Prep- I ? ?Eating- I ? ?Maintaining continence- I ? ?Transferring/Ambulation- I ? ?Managing Meds- I ? ?Follow up appointments reviewed: ? ?PCP Hospital f/u appt confirmed? YES Scheduled to see Deliah Boston FNP on 01-17-22 @ 1105am. ?Specialist Hospital f/u appt confirmed? No  . ?Are transportation arrangements needed? No  ?If their condition worsens, is the pt aware to call PCP or go to the Emergency Dept.? Yes ?Was the patient provided with contact information for the PCP's office or ED? Yes ?Was to pt encouraged to call back with questions or concerns? Yes  ?

## 2022-01-17 ENCOUNTER — Ambulatory Visit (INDEPENDENT_AMBULATORY_CARE_PROVIDER_SITE_OTHER): Payer: Medicare PPO | Admitting: Family Medicine

## 2022-01-17 ENCOUNTER — Encounter: Payer: Self-pay | Admitting: Family Medicine

## 2022-01-17 VITALS — BP 123/79 | HR 77 | Temp 97.9°F | Ht 67.0 in | Wt 290.6 lb

## 2022-01-17 DIAGNOSIS — J441 Chronic obstructive pulmonary disease with (acute) exacerbation: Secondary | ICD-10-CM

## 2022-01-17 DIAGNOSIS — R0602 Shortness of breath: Secondary | ICD-10-CM

## 2022-01-17 DIAGNOSIS — I700 Atherosclerosis of aorta: Secondary | ICD-10-CM | POA: Diagnosis not present

## 2022-01-17 DIAGNOSIS — Z09 Encounter for follow-up examination after completed treatment for conditions other than malignant neoplasm: Secondary | ICD-10-CM

## 2022-01-17 MED ORDER — ALBUTEROL SULFATE HFA 108 (90 BASE) MCG/ACT IN AERS: 2.0000 | INHALATION_SPRAY | RESPIRATORY_TRACT | 2 refills | Status: DC | PRN

## 2022-01-17 NOTE — Patient Instructions (Addendum)
How do I prepare for my test? ?? You should not eat a heavy meal just before this test. ?? You should not smoke for six hours before the test. ?? You should not exercise vigorously for six hours before the test. ?? On the day of the test, avoid food or drinks that have caffeine. ?? On the day of the test, wear loose clothing that does not restrict your breathing in any way. ?? If you have dentures, wear them during the test ? ?Do not use Albuterol 8 hours before the test. ?Do not use Symbicort 12 hours before the test.  ?

## 2022-01-17 NOTE — Progress Notes (Signed)
? ?Assessment & Plan:  ?1. COPD with acute exacerbation ([place]) ?Improving, although patient does not have an official diagnosis of COPD. He may continue Symbicort twice daily and start using Albuterol as needed. ?- CBC with Differential/Platelet ?- CMP14+EGFR ? ?2. Shortness of breath ?Started Albuterol as needed. PFTs ordered. Instructions for the day of the test provided on AVS. ?- albuterol (VENTOLIN HFA) 108 (90 Base) MCG/ACT inhaler; Inhale 2 puffs into the lungs every 4 (four) hours as needed for wheezing or shortness of breath.  Dispense: 18 g; Refill: 2 ?- Pulmonary function test; Future ? ?3. Hospital discharge follow-up ? ?4. Aortic atherosclerosis ([place]) ?New diagnosis from Chest CTA. Patient is already taking aspirin and simvastatin. ? ? ?Return in about 2 months (around 03/19/2022) for follow-up of chronic medication conditions. ? ?Hendricks Limes, MSN, APRN, FNP-C ?[place] ? ?Subjective:  ? ? Patient ID: Brian Steele, male    DOB: Jan 28, 1961, 61 y.o.   MRN: [id number] ? ?Patient Care Team: ?Loman Brooklyn, FNP as PCP - General (Family Medicine)  ? ?Chief Complaint:  ?Chief Complaint  ?Patient presents with  ? Transitions Of Care  ?  UNCR- COPD Exacerbation. Patient states he is still having SOB and chest hurts but it has improved from being in the hospital.   ? ? ?HPI: ?Brian Steele is a 61 y.o. male presenting on 01/17/2022 for Transitions Of Care (UNCR- COPD Exacerbation. Patient states he is still having SOB and chest hurts but it has improved from being in the hospital. ) ? ?Patient is accompanied by his significant other who he is okay with being present. ? ?Patient was hospitalized at [facility] from 01/12/2022-01/14/2022 with hypoxia due an acute exacerbation of COPD. He was treated with steroids and breathing treatments. His oxygen saturation remained above 90% during his admission. He was discharged with Symbicort to use twice daily and a Medrol dosepack. He reports today  he is still having shortness of breath and his chest still hurts, but it is improving. He has been using the Symbicort multiple times per day due to shortness of breath.  ? ?Patient does have baseline shortness of breath and occasionally wheezing. Denies a cough at baseline. He is a former smoker.  ? ?New complaints: ?None ? ? ?Social history: ? ?Relevant past medical, surgical, family and social history reviewed and updated as indicated. Interim medical history since our last visit reviewed. ? ?Allergies and medications reviewed and updated. ? ?DATA REVIEWED: CHART IN EPIC ? ?ROS: Negative unless specifically indicated above in HPI.  ? ? ?Current Outpatient Medications:  ?  aspirin 81 MG EC tablet, Take 1 tablet by mouth daily., Disp: , Rfl:  ?  budesonide-formoterol (SYMBICORT) 160-4.5 MCG/ACT inhaler, Inhale into the lungs., Disp: , Rfl:  ?  HYDROcodone-acetaminophen (NORCO/VICODIN) 5-325 MG tablet, Take 1 tablet by mouth 2 (two) times daily., Disp: , Rfl:  ?  methylPREDNISolone (MEDROL DOSEPAK) 4 MG TBPK tablet, See admin instructions., Disp: , Rfl:  ?  simvastatin (ZOCOR) 40 MG tablet, Take 1 tablet (40 mg total) by mouth daily., Disp: 90 tablet, Rfl: 1  ? ?Allergies  ?Allergen Reactions  ? Bee Venom Swelling  ? Other Hives  ?  In laundry detergents  ?In laundry detergents  ?  ? Sulfa Antibiotics Swelling  ? Sodium Hypochlorite Rash  ? ?Past Medical History:  ?Diagnosis Date  ? Allergy   ? Arthritis   ? Barrett's esophagus   ? Hyperlipidemia   ? Obstructive sleep  apnea   ? Prediabetes   ? Vitamin D deficiency   ?  ?Past Surgical History:  ?Procedure Laterality Date  ? APPENDECTOMY    ? CARPAL TUNNEL RELEASE    ? CHOLECYSTECTOMY  2013  ? KNEE ARTHROSCOPY    ? meniscal repair  ? NECK SURGERY    ? has plates  ? 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  ? SHOULDER SURGERY Bilateral   ?  ?Social History  ? ?Socioeconomic History  ? Marital status: Married  ?  Spouse name: Not on file  ? Number of children: Not on  file  ? Years of education: Not on file  ? Highest education level: Not on file  ?Occupational History  ? Not on file  ?Tobacco Use  ? Smoking status: Former  ?  Types: Cigarettes  ?  Quit date: 2018  ?  Years since quitting: 5.1  ? Smokeless tobacco: Never  ?Substance and Sexual Activity  ? Alcohol use: Not Currently  ? Drug use: Never  ? Sexual activity: Not on file  ?Other Topics Concern  ? Not on file  ?Social History Narrative  ? ** Merged History Encounter **  ?    ? ?Social Determinants of Health  ? ?Financial Resource Strain: Not on file  ?Food Insecurity: Not on file  ?Transportation Needs: Not on file  ?Physical Activity: Not on file  ?Stress: Not on file  ?Social Connections: Not on file  ?Intimate Partner Violence: Not on file  ?  ? ?   ?Objective:  ?  ?BP 123/79   Pulse 77   Temp 97.9 ?F (36.6 ?C) (Temporal)   Ht 5' 7"  (1.702 m)   Wt 290 lb 9.6 oz (131.8 kg)   SpO2 93%   BMI 45.51 kg/m?  ? ?Wt Readings from Last 3 Encounters:  ?01/17/22 290 lb 9.6 oz (131.8 kg)  ?09/10/21 288 lb (130.6 kg)  ?07/24/21 290 lb (131.5 kg)  ? ? ?Physical Exam ?Vitals reviewed.  ?Constitutional:   ?   General: He is not in acute distress. ?   Appearance: Normal appearance. He is morbidly obese. He is not ill-appearing, toxic-appearing or diaphoretic.  ?HENT:  ?   Head: Normocephalic and atraumatic.  ?Eyes:  ?   General: No scleral icterus.    ?   Right eye: No discharge.     ?   Left eye: No discharge.  ?   Conjunctiva/sclera: Conjunctivae normal.  ?Cardiovascular:  ?   Rate and Rhythm: Normal rate and regular rhythm.  ?   Heart sounds: Normal heart sounds. No murmur heard. ?  No friction rub. No gallop.  ?Pulmonary:  ?   Effort: Pulmonary effort is normal. No respiratory distress.  ?   Breath sounds: Normal breath sounds. No stridor. No wheezing, rhonchi or rales.  ?Musculoskeletal:     ?   General: Normal range of motion.  ?   Cervical back: Normal range of motion.  ?Skin: ?   General: Skin is warm and dry.   ?Neurological:  ?   Mental Status: He is alert and oriented to person, place, and time. Mental status is at baseline.  ?Psychiatric:     ?   Mood and Affect: Mood normal.     ?   Behavior: Behavior normal.     ?   Thought Content: Thought content normal.     ?   Judgment: Judgment normal.  ? ? ?No results found for: TSH ?Lab Results  ?Component Value  Date  ? WBC 9.9 07/24/2021  ? HGB 15.0 07/24/2021  ? HCT 45.1 07/24/2021  ? MCV 86 07/24/2021  ? PLT 290 07/24/2021  ? ?Lab Results  ?Component Value Date  ? NA 141 07/24/2021  ? K 4.5 07/24/2021  ? CO2 23 07/24/2021  ? GLUCOSE 85 07/24/2021  ? BUN 13 07/24/2021  ? CREATININE 1.03 07/24/2021  ? BILITOT 1.0 07/24/2021  ? ALKPHOS 121 07/24/2021  ? AST 26 07/24/2021  ? ALT 29 07/24/2021  ? PROT 7.5 07/24/2021  ? ALBUMIN 4.7 07/24/2021  ? CALCIUM 9.2 07/24/2021  ? EGFR 83 07/24/2021  ? ?Lab Results  ?Component Value Date  ? CHOL 164 07/24/2021  ? ?Lab Results  ?Component Value Date  ? HDL 40 07/24/2021  ? ?Lab Results  ?Component Value Date  ? [place] 97 07/24/2021  ? ?Lab Results  ?Component Value Date  ? TRIG 153 (H) 07/24/2021  ? ?Lab Results  ?Component Value Date  ? CHOLHDL 4.1 07/24/2021  ? ?Lab Results  ?Component Value Date  ? HGBA1C 5.5 07/24/2021  ? ? ?   ? ? ? ? ?

## 2022-01-18 LAB — CMP14+EGFR
ALT: 21 IU/L (ref 0–44)
AST: 18 IU/L (ref 0–40)
Albumin/Globulin Ratio: 1.4 (ref 1.2–2.2)
Albumin: 4 g/dL (ref 3.8–4.9)
Alkaline Phosphatase: 104 IU/L (ref 44–121)
BUN/Creatinine Ratio: 13 (ref 10–24)
BUN: 14 mg/dL (ref 8–27)
Bilirubin Total: 0.5 mg/dL (ref 0.0–1.2)
CO2: 22 mmol/L (ref 20–29)
Calcium: 8.9 mg/dL (ref 8.6–10.2)
Chloride: 105 mmol/L (ref 96–106)
Creatinine, Ser: 1.07 mg/dL (ref 0.76–1.27)
Globulin, Total: 2.8 g/dL (ref 1.5–4.5)
Glucose: 126 mg/dL — ABNORMAL HIGH (ref 70–99)
Potassium: 3.6 mmol/L (ref 3.5–5.2)
Sodium: 145 mmol/L — ABNORMAL HIGH (ref 134–144)
Total Protein: 6.8 g/dL (ref 6.0–8.5)
eGFR: 79 mL/min/{1.73_m2} (ref >59)

## 2022-01-18 LAB — CBC WITH DIFFERENTIAL/PLATELET
Basophils Absolute: 0.1 10*3/uL (ref 0.0–0.2)
Basos: 0 %
EOS (ABSOLUTE): 0.3 10*3/uL (ref 0.0–0.4)
Eos: 2 %
Hematocrit: 44.3 % (ref 37.5–51.0)
Hemoglobin: 14.9 g/dL (ref 13.0–17.7)
Immature Grans (Abs): 0.1 10*3/uL (ref 0.0–0.1)
Immature Granulocytes: 1 %
Lymphocytes Absolute: 3.8 10*3/uL — ABNORMAL HIGH (ref 0.7–3.1)
Lymphs: 28 %
MCH: 28.5 pg (ref 26.6–33.0)
MCHC: 33.6 g/dL (ref 31.5–35.7)
MCV: 85 fL (ref 79–97)
Monocytes Absolute: 0.9 10*3/uL (ref 0.1–0.9)
Monocytes: 6 %
Neutrophils Absolute: 8.5 10*3/uL — ABNORMAL HIGH (ref 1.4–7.0)
Neutrophils: 63 %
Platelets: 277 10*3/uL (ref 150–450)
RBC: 5.22 x10E6/uL (ref 4.14–5.80)
RDW: 13.4 % (ref 11.6–15.4)
WBC: 13.7 10*3/uL — ABNORMAL HIGH (ref 3.4–10.8)

## 2022-01-22 ENCOUNTER — Ambulatory Visit: Payer: Medicare Other | Admitting: Family Medicine

## 2022-01-30 ENCOUNTER — Telehealth: Payer: Self-pay | Admitting: Family Medicine

## 2022-01-30 NOTE — Telephone Encounter (Signed)
Patient aware.

## 2022-01-30 NOTE — Telephone Encounter (Signed)
I did not put in a referral, I ordered PFTs. Please make patient aware we need to follow-up with scheduling and we will get back to him.  ? ?Toni Amend, do you have any idea when he will be scheduled for the PFTs? ?

## 2022-01-30 NOTE — Telephone Encounter (Signed)
No referral placed- please advise  ?

## 2022-01-30 NOTE — Telephone Encounter (Signed)
Patient calling to check on the status of being send to a lung doctor. He stated that he spoke with Karsten Fells about this on 3/9 when he came in for his hospital follow up and was told that she was going to refer him. Please call back.  ?

## 2022-02-08 NOTE — Telephone Encounter (Signed)
Patient calls in today and still hasn't heard anything about referral to lung specialist @ [facility]. Please call ASAP. ?

## 2022-02-14 ENCOUNTER — Ambulatory Visit (HOSPITAL_COMMUNITY)
Admission: RE | Admit: 2022-02-14 | Discharge: 2022-02-14 | Disposition: A | Discharge status: Home / Self Care | Payer: Medicare PPO | Source: Ambulatory Visit | Attending: Family Medicine | Admitting: Family Medicine

## 2022-02-14 DIAGNOSIS — R0602 Shortness of breath: Secondary | ICD-10-CM | POA: Insufficient documentation

## 2022-02-14 LAB — PULMONARY FUNCTION TEST
DL/VA % pred: 108 %
DL/VA: 4.64 ml/min/mmHg/L
DLCO unc % pred: 103 %
DLCO unc: 25.83 ml/min/mmHg
FEF 25-75 Post: 2.89 L/sec
FEF 25-75 Pre: 1.95 L/sec
FEF2575-%Change-Post: 48 %
FEF2575-%Pred-Post: 108 %
FEF2575-%Pred-Pre: 73 %
FEV1-%Change-Post: 12 %
FEV1-%Pred-Post: 83 %
FEV1-%Pred-Pre: 74 %
FEV1-Post: 2.69 L
FEV1-Pre: 2.38 L
FEV1FVC-%Change-Post: 5 %
FEV1FVC-%Pred-Pre: 97 %
FEV6-%Change-Post: 6 %
FEV6-%Pred-Post: 85 %
FEV6-%Pred-Pre: 80 %
FEV6-Post: 3.45 L
FEV6-Pre: 3.23 L
FEV6FVC-%Change-Post: 0 %
FEV6FVC-%Pred-Post: 105 %
FEV6FVC-%Pred-Pre: 105 %
FVC-%Change-Post: 6 %
FVC-%Pred-Post: 81 %
FVC-%Pred-Pre: 76 %
FVC-Post: 3.45 L
FVC-Pre: 3.24 L
Post FEV1/FVC ratio: 78 %
Post FEV6/FVC ratio: 100 %
Pre FEV1/FVC ratio: 74 %
Pre FEV6/FVC Ratio: 100 %
RV % pred: 140 %
RV: 2.95 L
TLC % pred: 97 %
TLC: 6.22 L

## 2022-02-14 MED ORDER — ALBUTEROL SULFATE (2.5 MG/3ML) 0.083% IN NEBU
2.5000 mg | INHALATION_SOLUTION | Freq: Once | RESPIRATORY_TRACT | Status: AC
  Administered 2022-02-14: 2.5 mg via RESPIRATORY_TRACT

## 2022-02-15 ENCOUNTER — Encounter: Payer: Self-pay | Admitting: Family Medicine

## 2022-03-01 ENCOUNTER — Other Ambulatory Visit: Payer: Self-pay | Admitting: Family Medicine

## 2022-03-01 DIAGNOSIS — E782 Mixed hyperlipidemia: Secondary | ICD-10-CM

## 2022-03-20 ENCOUNTER — Ambulatory Visit (INDEPENDENT_AMBULATORY_CARE_PROVIDER_SITE_OTHER): Payer: Medicare PPO | Admitting: Family Medicine

## 2022-03-20 ENCOUNTER — Encounter: Payer: Self-pay | Admitting: Family Medicine

## 2022-03-20 VITALS — BP 112/65 | HR 62 | Temp 97.8°F | Ht 67.0 in | Wt 293.0 lb

## 2022-03-20 DIAGNOSIS — Z1211 Encounter for screening for malignant neoplasm of colon: Secondary | ICD-10-CM | POA: Diagnosis not present

## 2022-03-20 DIAGNOSIS — L02411 Cutaneous abscess of right axilla: Secondary | ICD-10-CM

## 2022-03-20 DIAGNOSIS — I700 Atherosclerosis of aorta: Secondary | ICD-10-CM

## 2022-03-20 DIAGNOSIS — E782 Mixed hyperlipidemia: Secondary | ICD-10-CM | POA: Diagnosis not present

## 2022-03-20 MED ORDER — CEPHALEXIN 500 MG PO CAPS: 500.0000 mg | ORAL_CAPSULE | Freq: Two times a day (BID) | ORAL | 0 refills | Status: AC

## 2022-03-20 NOTE — Progress Notes (Signed)
? ?Assessment & Plan:  ?1. Mixed hyperlipidemia ?Well controlled on current regimen.  ?- CBC with Differential/Platelet ?- CMP14+EGFR ?- Lipid panel ? ?2. Aortic atherosclerosis ([place]) ?Continue aspirin and simvastatin. ?- CMP14+EGFR ?- Lipid panel ? ?3. Abscess of right axilla ?Education provided on skin abscesses. ?- cephALEXin (KEFLEX) 500 MG capsule; Take 1 capsule (500 mg total) by mouth 2 (two) times daily for 7 days.  Dispense: 14 capsule; Refill: 0 ? ?4. Colon cancer screening ?- Ambulatory referral to Gastroenterology ? ? ?Return in about 6 months (around 09/20/2022) for annual physical. ? ?Hendricks Limes, MSN, APRN, FNP-C ?[place] ? ?Subjective:  ? ? Patient ID: Brian Steele, male    DOB: Mar 07, 1961, 61 y.o.   MRN: [id number] ? ?Patient Care Team: ?Loman Brooklyn, FNP as PCP - General (Family Medicine)  ? ?Chief Complaint:  ?Chief Complaint  ?Patient presents with  ? Medical Management of Chronic Issues  ?  2 month chronic check up   ? knot in right axilla  ?  X 2 days- painful, swelling that has gotten bigger  ? ? ?HPI: ?Brian Steele is a 61 y.o. male presenting on 03/20/2022 for Medical Management of Chronic Issues (2 month chronic check up ) and knot in right axilla (X 2 days- painful, swelling that has gotten bigger) ? ?Patient is accompanied by his wife who he is okay with being present. ? ?Aortic Atherosclerosis/Hyperlipidemia: taking aspirin and simvastatin. ? ?New complaints: ?Patient reports a marble size knot in his right armpit that he noticed 2 days ago.  States it is painful and has gotten bigger. ? ? ?Social history: ? ?Relevant past medical, surgical, family and social history reviewed and updated as indicated. Interim medical history since our last visit reviewed. ? ?Allergies and medications reviewed and updated. ? ?DATA REVIEWED: CHART IN EPIC ? ?ROS: Negative unless specifically indicated above in HPI.  ? ? ?Current Outpatient Medications:  ?  albuterol  (VENTOLIN HFA) 108 (90 Base) MCG/ACT inhaler, Inhale 2 puffs into the lungs every 4 (four) hours as needed for wheezing or shortness of breath., Disp: 18 g, Rfl: 2 ?  aspirin 81 MG EC tablet, Take 1 tablet by mouth daily., Disp: , Rfl:  ?  budesonide-formoterol (SYMBICORT) 160-4.5 MCG/ACT inhaler, Inhale into the lungs., Disp: , Rfl:  ?  HYDROcodone-acetaminophen (NORCO/VICODIN) 5-325 MG tablet, Take 1 tablet by mouth 2 (two) times daily., Disp: , Rfl:  ?  simvastatin (ZOCOR) 40 MG tablet, TAKE 1 TABLET BY MOUTH EVERY DAY, Disp: 90 tablet, Rfl: 0  ? ?Allergies  ?Allergen Reactions  ? Bee Venom Swelling  ? Other Hives  ?  In laundry detergents  ?In laundry detergents  ?  ? Sulfa Antibiotics Swelling  ? Sodium Hypochlorite Rash  ? ?Past Medical History:  ?Diagnosis Date  ? Allergy   ? Aortic atherosclerosis ([place]) 01/12/2022  ? Arthritis   ? Barrett's esophagus   ? Hyperlipidemia   ? Obstructive sleep apnea   ? Prediabetes   ? Vitamin D deficiency   ?  ?Past Surgical History:  ?Procedure Laterality Date  ? APPENDECTOMY    ? CARPAL TUNNEL RELEASE    ? CHOLECYSTECTOMY  2013  ? KNEE ARTHROSCOPY    ? meniscal repair  ? NECK SURGERY    ? has plates  ? 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  ? SHOULDER SURGERY Bilateral   ?  ?Social History  ? ?Socioeconomic History  ? Marital status: Married  ?  Spouse  name: Not on file  ? Number of children: Not on file  ? Years of education: Not on file  ? Highest education level: Not on file  ?Occupational History  ? Not on file  ?Tobacco Use  ? Smoking status: Former  ?  Types: Cigarettes  ?  Quit date: 2018  ?  Years since quitting: 5.3  ? Smokeless tobacco: Never  ?Substance and Sexual Activity  ? Alcohol use: Not Currently  ? Drug use: Never  ? Sexual activity: Not on file  ?Other Topics Concern  ? Not on file  ?Social History Narrative  ? ** Merged History Encounter **  ?    ? ?Social Determinants of Health  ? ?Financial Resource Strain: Not on file  ?Food Insecurity: Not on file   ?Transportation Needs: Not on file  ?Physical Activity: Not on file  ?Stress: Not on file  ?Social Connections: Not on file  ?Intimate Partner Violence: Not on file  ?  ? ?   ?Objective:  ?  ?BP 112/65   Pulse 62   Temp 97.8 ?F (36.6 ?C) (Temporal)   Ht _0  (1.702 m)   Wt 293 lb (132.9 kg)   SpO2 92%   BMI 45.89 kg/m?  ? ?Wt Readings from Last 3 Encounters:  ?03/20/22 293 lb (132.9 kg)  ?01/17/22 290 lb 9.6 oz (131.8 kg)  ?09/10/21 288 lb (130.6 kg)  ? ? ?Physical Exam ?Vitals reviewed.  ?Constitutional:   ?   General: He is not in acute distress. ?   Appearance: Normal appearance. He is morbidly obese. He is not ill-appearing, toxic-appearing or diaphoretic.  ?HENT:  ?   Head: Normocephalic and atraumatic.  ?Eyes:  ?   General: No scleral icterus.    ?   Right eye: No discharge.     ?   Left eye: No discharge.  ?   Conjunctiva/sclera: Conjunctivae normal.  ?Cardiovascular:  ?   Rate and Rhythm: Normal rate and regular rhythm.  ?   Heart sounds: Normal heart sounds. No murmur heard. ?  No friction rub. No gallop.  ?Pulmonary:  ?   Effort: Pulmonary effort is normal. No respiratory distress.  ?   Breath sounds: Normal breath sounds. No stridor. No wheezing, rhonchi or rales.  ?Musculoskeletal:     ?   General: Normal range of motion.  ?   Cervical back: Normal range of motion.  ?Skin: ?   General: Skin is warm and dry.  ?   Findings: Abscess (right axilla - 1 cm round with erythema; no warmth or drainage) present.  ?Neurological:  ?   Mental Status: He is alert and oriented to person, place, and time. Mental status is at baseline.  ?Psychiatric:     ?   Mood and Affect: Mood normal.     ?   Behavior: Behavior normal.     ?   Thought Content: Thought content normal.     ?   Judgment: Judgment normal.  ? ? ?No results found for: TSH ?Lab Results  ?Component Value Date  ? WBC 13.7 (H) 01/17/2022  ? HGB 14.9 01/17/2022  ? HCT 44.3 01/17/2022  ? MCV 85 01/17/2022  ? PLT 277 01/17/2022  ? ?Lab Results  ?Component  Value Date  ? NA 145 (H) 01/17/2022  ? K 3.6 01/17/2022  ? CO2 22 01/17/2022  ? GLUCOSE 126 (H) 01/17/2022  ? BUN 14 01/17/2022  ? CREATININE 1.07 01/17/2022  ? BILITOT 0.5 01/17/2022  ?  ALKPHOS 104 01/17/2022  ? AST 18 01/17/2022  ? ALT 21 01/17/2022  ? PROT 6.8 01/17/2022  ? ALBUMIN 4.0 01/17/2022  ? CALCIUM 8.9 01/17/2022  ? EGFR 79 01/17/2022  ? ?Lab Results  ?Component Value Date  ? CHOL 164 07/24/2021  ? ?Lab Results  ?Component Value Date  ? HDL 40 07/24/2021  ? ?Lab Results  ?Component Value Date  ? [place] 97 07/24/2021  ? ?Lab Results  ?Component Value Date  ? TRIG 153 (H) 07/24/2021  ? ?Lab Results  ?Component Value Date  ? CHOLHDL 4.1 07/24/2021  ? ?Lab Results  ?Component Value Date  ? HGBA1C 5.5 07/24/2021  ? ? ?   ? ? ? ? ?

## 2022-03-21 LAB — CBC WITH DIFFERENTIAL/PLATELET
Basophils Absolute: 0.1 10*3/uL (ref 0.0–0.2)
Basos: 1 %
EOS (ABSOLUTE): 0.7 10*3/uL — ABNORMAL HIGH (ref 0.0–0.4)
Eos: 9 %
Hematocrit: 41.9 % (ref 37.5–51.0)
Hemoglobin: 14.2 g/dL (ref 13.0–17.7)
Immature Grans (Abs): 0 10*3/uL (ref 0.0–0.1)
Immature Granulocytes: 0 %
Lymphocytes Absolute: 2.8 10*3/uL (ref 0.7–3.1)
Lymphs: 34 %
MCH: 28.7 pg (ref 26.6–33.0)
MCHC: 33.9 g/dL (ref 31.5–35.7)
MCV: 85 fL (ref 79–97)
Monocytes Absolute: 0.6 10*3/uL (ref 0.1–0.9)
Monocytes: 7 %
Neutrophils Absolute: 4.2 10*3/uL (ref 1.4–7.0)
Neutrophils: 49 %
Platelets: 278 10*3/uL (ref 150–450)
RBC: 4.94 x10E6/uL (ref 4.14–5.80)
RDW: 14.3 % (ref 11.6–15.4)
WBC: 8.4 10*3/uL (ref 3.4–10.8)

## 2022-03-21 LAB — CMP14+EGFR
ALT: 25 IU/L (ref 0–44)
AST: 21 IU/L (ref 0–40)
Albumin/Globulin Ratio: 1.5 (ref 1.2–2.2)
Albumin: 4.3 g/dL (ref 3.8–4.8)
Alkaline Phosphatase: 110 IU/L (ref 44–121)
BUN/Creatinine Ratio: 13 (ref 10–24)
BUN: 15 mg/dL (ref 8–27)
Bilirubin Total: 0.7 mg/dL (ref 0.0–1.2)
CO2: 21 mmol/L (ref 20–29)
Calcium: 9.1 mg/dL (ref 8.6–10.2)
Chloride: 107 mmol/L — ABNORMAL HIGH (ref 96–106)
Creatinine, Ser: 1.13 mg/dL (ref 0.76–1.27)
Globulin, Total: 2.9 g/dL (ref 1.5–4.5)
Glucose: 104 mg/dL — ABNORMAL HIGH (ref 70–99)
Potassium: 4.3 mmol/L (ref 3.5–5.2)
Sodium: 143 mmol/L (ref 134–144)
Total Protein: 7.2 g/dL (ref 6.0–8.5)
eGFR: 74 mL/min/{1.73_m2} (ref >59)

## 2022-03-21 LAB — LIPID PANEL
Chol/HDL Ratio: 3.5 ratio (ref 0.0–5.0)
Cholesterol, Total: 150 mg/dL (ref 100–199)
HDL: 43 mg/dL (ref >39)
LDL Chol Calc (NIH): 79 mg/dL (ref 0–99)
Triglycerides: 160 mg/dL — ABNORMAL HIGH (ref 0–149)
VLDL Cholesterol Cal: 28 mg/dL (ref 5–40)

## 2022-03-29 ENCOUNTER — Encounter: Payer: Self-pay | Admitting: Family Medicine

## 2022-05-16 ENCOUNTER — Other Ambulatory Visit: Payer: Self-pay | Admitting: Family Medicine

## 2022-05-16 DIAGNOSIS — E782 Mixed hyperlipidemia: Secondary | ICD-10-CM

## 2022-06-19 ENCOUNTER — Encounter: Payer: Self-pay | Admitting: *Deleted

## 2022-08-27 ENCOUNTER — Ambulatory Visit (INDEPENDENT_AMBULATORY_CARE_PROVIDER_SITE_OTHER): Payer: Medicare PPO | Admitting: Nurse Practitioner

## 2022-08-27 ENCOUNTER — Ambulatory Visit (INDEPENDENT_AMBULATORY_CARE_PROVIDER_SITE_OTHER): Payer: Medicare PPO

## 2022-08-27 ENCOUNTER — Encounter: Payer: Self-pay | Admitting: Nurse Practitioner

## 2022-08-27 VITALS — BP 128/70 | HR 70 | Temp 98.8°F | Ht 67.0 in | Wt 286.6 lb

## 2022-08-27 DIAGNOSIS — J069 Acute upper respiratory infection, unspecified: Secondary | ICD-10-CM

## 2022-08-27 MED ORDER — AZITHROMYCIN 250 MG PO TABS: ORAL_TABLET | ORAL | 0 refills | Status: AC

## 2022-08-27 MED ORDER — PREDNISONE 10 MG (21) PO TBPK: ORAL_TABLET | ORAL | 0 refills | Status: DC

## 2022-08-27 NOTE — Progress Notes (Signed)
Acute Office Visit  Subjective:     Patient ID: Brian Steele, male    DOB: Mar 23, 1961, 61 y.o.   MRN: [id number]  Chief Complaint  Patient presents with   Shortness of Breath   Chest Pain   Cough    clear    Shortness of Breath This is a new problem. The current episode started in the past 7 days. The problem occurs constantly. The problem has been unchanged. Associated symptoms include chest pain. Pertinent negatives include no rash or sore throat. Nothing aggravates the symptoms.  Cough This is a new problem. The current episode started in the past 7 days. The problem has been rapidly worsening. The problem occurs constantly. The cough is Productive of sputum. Associated symptoms include chest pain, chills, nasal congestion and shortness of breath. Pertinent negatives include no rash or sore throat. Nothing aggravates the symptoms. He has tried OTC cough suppressant for the symptoms. The treatment provided no relief.  URI  This is a new problem. The current episode started in the past 7 days. The problem has been unchanged. There has been no fever. Associated symptoms include chest pain, congestion and coughing. Pertinent negatives include no rash, sinus pain, sneezing or sore throat. He has tried decongestant for the symptoms. The treatment provided no relief.     Review of Systems  Constitutional:  Positive for chills.  HENT:  Positive for congestion. Negative for sinus pain, sneezing and sore throat.   Respiratory:  Positive for cough and shortness of breath.   Cardiovascular:  Positive for chest pain.  Skin: Negative.  Negative for itching and rash.  All other systems reviewed and are negative.       Objective:    BP 128/70   Pulse 70   Temp 98.8 F (37.1 C)   Ht 5\' 7"  (1.702 m)   Wt 286 lb 9.6 oz (130 kg)   SpO2 95%   BMI 44.89 kg/m  Wt Readings from Last 3 Encounters:  08/27/22 286 lb 9.6 oz (130 kg)  03/20/22 293 lb (132.9 kg)  01/17/22 290 lb 9.6 oz  (131.8 kg)      Physical Exam Vitals and nursing note reviewed.  Constitutional:      Appearance: He is well-developed. He is obese.  HENT:     Head: Normocephalic.     Mouth/Throat:     Mouth: Mucous membranes are moist.  Cardiovascular:     Rate and Rhythm: Normal rate.     Pulses: Normal pulses.     Heart sounds: Normal heart sounds.  Pulmonary:     Effort: Pulmonary effort is normal.     Breath sounds: Normal breath sounds.  Skin:    General: Skin is warm.  Neurological:     General: No focal deficit present.     Mental Status: He is alert and oriented to person, place, and time.  Psychiatric:        Behavior: Behavior normal.     No results found for any visits on 08/27/22.      Assessment & Plan:  Patient presents with upper respiratory infection symptoms, chills, productive cough, wheezing, shortness of breath and chest congestion.   Take meds as prescribed - Use a cool mist humidifier  -Use saline nose sprays frequently -Force fluids -For fever or aches or pains- take Tylenol or ibuprofen. Follow up with worsening unresolved symptoms    Problem List Items Addressed This Visit   None Visit Diagnoses  URI with cough and congestion    -  Primary   Relevant Medications   azithromycin (ZITHROMAX) 250 MG tablet   predniSONE (STERAPRED UNI-PAK 21 TAB) 10 MG (21) TBPK tablet   Other Relevant Orders   DG Chest 2 View   Novel Coronavirus, NAA ([facility])       Meds ordered this encounter  Medications   azithromycin (ZITHROMAX) 250 MG tablet    Sig: Take 2 tablets on day 1, then 1 tablet daily on days 2 through 5    Dispense:  6 tablet    Refill:  0    Order Specific Question:   Supervising Provider    Answer:   Jeneen Rinks   predniSONE (STERAPRED UNI-PAK 21 TAB) 10 MG (21) TBPK tablet    Sig: 6 tablet day 1, 5 tablet day 2, 4 tablet day 3, 3 tablet day 4, 2 tablet day 5, 1 tablet day 6    Dispense:  1 each    Refill:  0    Order  Specific Question:   Supervising Provider    AnswerJeneen Rinks    Return if symptoms worsen or fail to improve.  Ivy Lynn, NP

## 2022-08-27 NOTE — Patient Instructions (Signed)

## 2022-08-28 ENCOUNTER — Other Ambulatory Visit: Payer: Self-pay | Admitting: Nurse Practitioner

## 2022-08-28 DIAGNOSIS — J189 Pneumonia, unspecified organism: Secondary | ICD-10-CM

## 2022-08-28 LAB — NOVEL CORONAVIRUS, NAA: SARS-CoV-2, NAA: NOT DETECTED

## 2022-09-04 ENCOUNTER — Telehealth: Payer: Self-pay | Admitting: Family Medicine

## 2022-09-04 NOTE — Telephone Encounter (Signed)
Advised pt he would need to be seen if he is still feeling bad , states he would call back to make a apt

## 2022-09-05 ENCOUNTER — Encounter: Payer: Self-pay | Admitting: Family Medicine

## 2022-09-05 ENCOUNTER — Ambulatory Visit (INDEPENDENT_AMBULATORY_CARE_PROVIDER_SITE_OTHER): Payer: Medicare PPO | Admitting: Family Medicine

## 2022-09-05 VITALS — BP 124/73 | HR 75 | Temp 97.3°F | Ht 67.0 in | Wt 283.5 lb

## 2022-09-05 DIAGNOSIS — J189 Pneumonia, unspecified organism: Secondary | ICD-10-CM

## 2022-09-05 MED ORDER — DOXYCYCLINE HYCLATE 100 MG PO TABS: 100.0000 mg | ORAL_TABLET | Freq: Two times a day (BID) | ORAL | 0 refills | Status: AC

## 2022-09-05 MED ORDER — METHYLPREDNISOLONE ACETATE 80 MG/ML IJ SUSP
80.0000 mg | Freq: Once | INTRAMUSCULAR | Status: AC
  Administered 2022-09-05: 80 mg via INTRAMUSCULAR

## 2022-09-05 NOTE — Patient Instructions (Signed)
Community-Acquired Pneumonia, Adult Pneumonia is a lung infection that causes inflammation and the buildup of mucus and fluids in the lungs. This may cause coughing and difficulty breathing. Community-acquired pneumonia is pneumonia that develops in people who are not, and have not recently been, in a hospital or other health care facility. Usually, pneumonia develops as a result of an illness that is caused by a virus, such as the common cold and the flu (influenza). It can also be caused by bacteria or fungi. While the common cold and influenza can pass from person to person (are contagious), pneumonia itself is not considered contagious. What are the causes? This condition may be caused by: Viruses. Bacteria. Fungi. What increases the risk? The following factors may make you more likely to develop this condition: Being over age 65 or having certain medical conditions, such as: A long-term (chronic) disease, such as: chronic obstructive pulmonary disease (COPD), asthma, heart failure, diabetes, or kidney disease. A condition that increases the risk of breathing in (aspirating) mucus and other fluids from your mouth and nose. A weakened body defense system (immune system). Having had your spleen removed (splenectomy). The spleen is the organ that helps fight germs and infections. Not cleaning your teeth and gums well (poor dental hygiene). Using tobacco products. Traveling to places where germs that cause pneumonia are present or being near certain animals or animal habitats that could have germs that cause pneumonia. What are the signs or symptoms? Symptoms of this condition include: A dry cough or a wet (productive) cough. A fever, sweating, or chills. Chest pain, especially when breathing deeply or coughing. Fast breathing, difficulty breathing, or shortness of breath. Tiredness (fatigue) and muscle aches. How is this diagnosed? This condition may be diagnosed based on your medical  history or a physical exam. You may also have tests, including: Imaging, such as a chest X-ray or lung ultrasound. Tests of: The level of oxygen and other gases in your blood. Mucus from your lungs (sputum). Fluid around your lungs (pleural fluid). Your urine. How is this treated? Treatment for this condition depends on many factors, such as the cause of your pneumonia, your medicines, and other medical conditions that you have. For most adults, pneumonia may be treated at home. In some cases, treatment must happen in a hospital and may include: Medicines that are given by mouth (orally) or through an IV, including: Antibiotic medicines, if bacteria caused the pneumonia. Medicines that kill viruses (antiviral medicines), if a virus caused the pneumonia. Oxygen therapy. Severe pneumonia, although rare, may require the following treatments: Mechanical ventilation.This procedure uses a machine to help you breathe if you cannot breathe well on your own or maintain a safe level of blood oxygen. Thoracentesis. This procedure removes any buildup of pleural fluid to help with breathing. Follow these instructions at home:  Medicines Take over-the-counter and prescription medicines only as told by your health care provider. Take cough medicine only if you have trouble sleeping. Cough medicine can prevent your body from removing mucus from your lungs. If you were prescribed antibiotics, take them as told by your health care provider. Do not stop taking the antibiotic even if you start to feel better. Lifestyle     Do not drink alcohol. Do not use any products that contain nicotine or tobacco. These products include cigarettes, chewing tobacco, and vaping devices, such as e-cigarettes. If you need help quitting, ask your health care provider. Eat a healthy diet. This includes plenty of vegetables, fruits, whole grains, low-fat   dairy products, and lean protein. General instructions Rest a lot and  get at least 8 hours of sleep each night. Sleep in a partly upright position at night. Place a few pillows under your head or sleep in a reclining chair. Return to your normal activities as told by your health care provider. Ask your health care provider what activities are safe for you. Drink enough fluid to keep your urine pale yellow. This helps to thin the mucus in your lungs. If your throat is sore, gargle with a mixture of salt and water 3-4 times a day or as needed. To make salt water, completely dissolve -1 tsp (3-6 g) of salt in 1 cup (237 mL) of warm water. Keep all follow-up visits. How is this prevented? You can lower your risk of developing community-acquired pneumonia by: Getting the pneumonia vaccine. There are different types and schedules of pneumonia vaccines. Ask your health care provider which option is best for you. Consider getting the pneumonia vaccine if: You are older than 61 years of age. You are 19-65 years of age and are receiving cancer treatment, have chronic lung disease, or have other medical conditions that affect your immune system. Ask your health care provider if this applies to you. Getting your influenza vaccine every year. Ask your health care provider which type of vaccine is best for you. Getting regular dental checkups. Washing your hands often with soap and water for at least 20 seconds. If soap and water are not available, use hand sanitizer. Contact a health care provider if: You have a fever. You have trouble sleeping because you cannot control your cough with cough medicine. Get help right away if: Your shortness of breath becomes worse. Your chest pain increases. Your sickness becomes worse, especially if you are an older adult or have a weak immune system. You cough up blood. These symptoms may be an emergency. Get help right away. Call 911. Do not wait to see if the symptoms will go away. Do not drive yourself to the  hospital. Summary Pneumonia is an infection of the lungs. Community-acquired pneumonia develops in people who have not been in the hospital. It can be caused by bacteria, viruses, or fungi. This condition may be treated with antibiotics or antiviral medicines. Severe pneumonia may require a hospital stay and treatment to help with breathing. This information is not intended to replace advice given to you by your health care provider. Make sure you discuss any questions you have with your health care provider. Document Revised: 12/26/2021 Document Reviewed: 12/26/2021 Elsevier Patient Education  2023 [employer]  

## 2022-09-05 NOTE — Progress Notes (Signed)
Established Patient Office Visit  Subjective   Patient ID: Brian Steele, male    DOB: 1961/10/24  Age: 61 y.o. MRN: [id number]  Chief Complaint  Patient presents with   Pneumonia    Pneumonia   Here with wife today. Brian Steele is here for a follow up of pneumonia. He was seen on 08/27/22 for cough, congestion, and shortness of breath. His chest xray showed right middle lobe pneumonia. He was prescribed a zpak and prednisone. He completed this 3 days ago. He reports that he feels no better. He continues to have productive cough with yellow sputum. He continues to have shortness of breath. He feels short of breath moving from one chair to the other. He feels like a truck hit him. Denies fever. He does report some pain in the center of his chest with coughing fits. He has been taking tessalon perles, nyquil, and alka seltzer without improvement. He has not been using his inhalers. He has a history of COPD and is a former smoker.     ROS As per HPI.    Objective:     BP 124/73   Pulse 75   Temp (!) 97.3 F (36.3 C) (Oral)   Ht 5\' 7"  (1.702 m)   Wt 283 lb 8 oz (128.6 kg)   SpO2 93%   BMI 44.40 kg/m  BP Readings from Last 3 Encounters:  09/05/22 124/73  08/27/22 128/70  03/20/22 112/65   SpO2 Readings from Last 3 Encounters:  09/05/22 93%  08/27/22 95%  03/20/22 92%     Physical Exam Vitals and nursing note reviewed.  Constitutional:      General: He is not in acute distress.    Appearance: He is not toxic-appearing or diaphoretic.  Eyes:     General:        Right eye: No discharge.        Left eye: No discharge.     Pupils: Pupils are equal, round, and reactive to light.  Cardiovascular:     Rate and Rhythm: Normal rate and regular rhythm.     Heart sounds: Normal heart sounds. No murmur heard. Pulmonary:     Effort: Pulmonary effort is normal.     Breath sounds: Rhonchi present. No wheezing or rales.  Musculoskeletal:     Cervical back: No rigidity.     Right  lower leg: No edema.     Left lower leg: No edema.  Skin:    General: Skin is warm and dry.  Neurological:     General: No focal deficit present.     Mental Status: He is alert and oriented to person, place, and time.  Psychiatric:        Mood and Affect: Mood normal.        Behavior: Behavior normal.      No results found for any visits on 09/05/22.    The 10-year ASCVD risk score (Arnett DK, et al., 2019) is: 7.7%    Assessment & Plan:   Cleophas was seen today for pneumonia.  Diagnoses and all orders for this visit:  Community acquired pneumonia of right middle lobe of lung No improvement in symptoms. Reviewed CXR report from 08/27/22. Inadequately treated with azithromycin alone and steroids. Doxycyline ordered today. Sample of breztri given. Steroid IM injection today in office. Discussed needs repeat imaging after 4-6 weeks for re-evaluation of pneumonia and possible postobstructive process on imaging. Patinet is scheduled for follow up with PCP in 2 weeks. Strict return  precautions given. Discussed when to seek emergency care.  -     doxycycline (VIBRA-TABS) 100 MG tablet; Take 1 tablet (100 mg total) by mouth 2 (two) times daily for 10 days. 1 po bid -     methylPREDNISolone acetate (DEPO-MEDROL) injection 80 mg  Return in about 2 weeks (around 09/19/2022) for with PCP for pneumonia follow up.   The patient indicates understanding of these issues and agrees with the plan.  Brian Earing, FNP

## 2022-09-19 ENCOUNTER — Ambulatory Visit (INDEPENDENT_AMBULATORY_CARE_PROVIDER_SITE_OTHER): Payer: Medicare PPO | Admitting: Family Medicine

## 2022-09-19 ENCOUNTER — Ambulatory Visit (INDEPENDENT_AMBULATORY_CARE_PROVIDER_SITE_OTHER): Payer: Medicare PPO

## 2022-09-19 ENCOUNTER — Encounter: Payer: Self-pay | Admitting: Family Medicine

## 2022-09-19 VITALS — BP 115/64 | HR 61 | Temp 97.6°F | Ht 67.0 in | Wt 281.2 lb

## 2022-09-19 DIAGNOSIS — J449 Chronic obstructive pulmonary disease, unspecified: Secondary | ICD-10-CM | POA: Diagnosis not present

## 2022-09-19 DIAGNOSIS — J189 Pneumonia, unspecified organism: Secondary | ICD-10-CM

## 2022-09-19 MED ORDER — BREZTRI AEROSPHERE 160-9-4.8 MCG/ACT IN AERO: 2.0000 | INHALATION_SPRAY | Freq: Two times a day (BID) | RESPIRATORY_TRACT | 11 refills | Status: DC

## 2022-09-19 NOTE — Progress Notes (Signed)
Subjective:  Patient ID: Brian Steele, male    DOB: 08/28/1961, 61 y.o.   MRN: [id number]  Patient Care Team: Sonny Masters, FNP as PCP - General (Family Medicine)   Chief Complaint:  Pneumonia (2 week follow up )   HPI: Brian Steele is a 61 y.o. male presenting on 09/19/2022 for Pneumonia (2 week follow up )   Pt presents today for CAP follow up. He was initially diagnosed 10/17 and placed on a Z-Pack. Followed up in office 10/26 and had not improved so he was placed on doxycycline, Breztri and given steroid injection in office. Chart review revealed prior history of COPD. He states he has never been told this but does have albuterol and Symbicort prescribed. He does have a history of heavy cigarette smoking as well. He states he is doing significantly better since last office visit. Denies shortness of breath, sputum production, fever, chills, weakness, or confusion. Does have a cough but greatly improved.      Relevant past medical, surgical, family, and social history reviewed and updated as indicated.  Allergies and medications reviewed and updated. Data reviewed: Chart in Epic.   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   SHOULDER SURGERY Bilateral     Social History   Socioeconomic History   Marital status: Married    Spouse name: Not on file   Number of children: Not on file   Years of education: Not on file   Highest education level: Not on file  Occupational History   Not on file  Tobacco Use   Smoking status: Former    Types: Cigarettes    Quit  date: 2018    Years since quitting: 5.8   Smokeless tobacco: Never  Substance and Sexual Activity   Alcohol use: Not Currently   Drug use: Never   Sexual activity: Not on file  Other Topics Concern   Not on file  Social History Narrative   ** Merged History Encounter **       Social Determinants of Health   Financial Resource Strain: Not on file  Food Insecurity: Not on file  Transportation Needs: Not on file  Physical Activity: Not on file  Stress: Not on file  Social Connections: Not on file  Intimate Partner Violence: Not on file    Outpatient Encounter Medications as of 09/19/2022  Medication Sig   albuterol (VENTOLIN HFA) 108 (90 Base) MCG/ACT inhaler Inhale 2 puffs into the lungs every 4 (four) hours as needed for wheezing or shortness of breath.   aspirin 81 MG EC tablet Take 1 tablet by mouth daily.   Budeson-Glycopyrrol-Formoterol (BREZTRI AEROSPHERE) 160-9-4.8 MCG/ACT AERO Inhale 2 puffs into the lungs 2 (two) times daily.   HYDROcodone-acetaminophen (NORCO/VICODIN) 5-325 MG tablet Take 1 tablet by mouth 2 (two) times daily.   simvastatin (ZOCOR) 40 MG tablet TAKE 1 TABLET BY MOUTH EVERY DAY   [DISCONTINUED] budesonide-formoterol (SYMBICORT) 160-4.5 MCG/ACT inhaler  Inhale into the lungs.   No facility-administered encounter medications on file as of 09/19/2022.    Allergies  Allergen Reactions   Bee Venom Swelling   Other Hives    In laundry detergents  In laundry detergents     Sulfa Antibiotics Swelling   Sodium Hypochlorite Rash    Review of Systems  Constitutional:  Negative for activity change, appetite change, chills, diaphoresis, fatigue, fever and unexpected weight change.  HENT: Negative.    Eyes: Negative.   Respiratory:  Positive for cough. Negative for chest tightness and shortness of breath.   Cardiovascular:  Negative for chest pain, palpitations and leg swelling.  Gastrointestinal:  Negative for blood in stool, constipation, diarrhea, nausea  and vomiting.  Endocrine: Negative.   Genitourinary:  Negative for decreased urine volume, difficulty urinating, dysuria, frequency and urgency.  Musculoskeletal:  Negative for arthralgias and myalgias.  Skin: Negative.   Allergic/Immunologic: Negative.   Neurological:  Negative for dizziness, weakness and headaches.  Hematological: Negative.   Psychiatric/Behavioral:  Negative for confusion, hallucinations, sleep disturbance and suicidal ideas.   All other systems reviewed and are negative.       Objective:  BP 115/64   Pulse 61   Temp 97.6 F (36.4 C) (Temporal)   Ht 5\' 7"  (1.702 m)   Wt 281 lb 3.2 oz (127.6 kg)   SpO2 91%   BMI 44.04 kg/m    Wt Readings from Last 3 Encounters:  09/19/22 281 lb 3.2 oz (127.6 kg)  09/05/22 283 lb 8 oz (128.6 kg)  08/27/22 286 lb 9.6 oz (130 kg)    Physical Exam Vitals and nursing note reviewed.  Constitutional:      General: He is not in acute distress.    Appearance: Normal appearance. He is well-developed and well-groomed. He is morbidly obese. He is not ill-appearing, toxic-appearing or diaphoretic.  HENT:     Head: Normocephalic and atraumatic.     Jaw: There is normal jaw occlusion.     Right Ear: Hearing normal.     Left Ear: Hearing normal.     Nose: Nose normal.     Mouth/Throat:     Lips: Pink.     Mouth: Mucous membranes are moist.     Pharynx: Oropharynx is clear. Uvula midline.  Eyes:     General: Lids are normal.     Extraocular Movements: Extraocular movements intact.     Conjunctiva/sclera: Conjunctivae normal.     Pupils: Pupils are equal, round, and reactive to light.  Neck:     Thyroid: No thyroid mass, thyromegaly or thyroid tenderness.     Vascular: No carotid bruit or JVD.     Trachea: Trachea and phonation normal.  Cardiovascular:     Rate and Rhythm: Normal rate and regular rhythm.     Chest Wall: PMI is not displaced.     Pulses: Normal pulses.     Heart sounds: Normal heart sounds. No murmur  heard.    No friction rub. No gallop.  Pulmonary:     Effort: Pulmonary effort is normal. No respiratory distress.     Breath sounds: Rhonchi (minimal in RLL, clears with cough) present. No wheezing.  Abdominal:     General: Bowel sounds are normal. There is no distension or abdominal bruit.     Palpations: Abdomen is soft. There is no hepatomegaly or splenomegaly.     Tenderness: There is no abdominal tenderness. There is no right CVA tenderness or left CVA tenderness.  Hernia: No hernia is present.  Musculoskeletal:        General: Normal range of motion.     Cervical back: Normal range of motion and neck supple.     Right lower leg: No edema.     Left lower leg: No edema.  Lymphadenopathy:     Cervical: No cervical adenopathy.  Skin:    General: Skin is warm and dry.     Capillary Refill: Capillary refill takes less than 2 seconds.     Coloration: Skin is not cyanotic, jaundiced or pale.     Findings: No rash.  Neurological:     General: No focal deficit present.     Mental Status: He is alert and oriented to person, place, and time.     Sensory: Sensation is intact.     Motor: Motor function is intact.     Coordination: Coordination is intact.     Gait: Gait is intact.     Deep Tendon Reflexes: Reflexes are normal and symmetric.  Psychiatric:        Attention and Perception: Attention and perception normal.        Mood and Affect: Mood and affect normal.        Speech: Speech normal.        Behavior: Behavior normal. Behavior is cooperative.        Thought Content: Thought content normal.        Cognition and Memory: Cognition and memory normal.        Judgment: Judgment normal.     Results for orders placed or performed in visit on 08/27/22  Novel Coronavirus, NAA ([facility])   Specimen: Nasopharyngeal(NP) swabs in vial transport medium  Result Value Ref Range   SARS-CoV-2, NAA Not Detected Not Detected     X-Ray: CXR: great improvement from prior CXR. Preliminary  x-ray reading by Kari Baars, FNP-C, [facility].   Pertinent labs & imaging results that were available during my care of the patient were reviewed by me and considered in my medical decision making.  Assessment & Plan:  Brian Steele was seen today for pneumonia.  Diagnoses and all orders for this visit:  Community acquired pneumonia of right lower lobe of lung Community acquired pneumonia of right middle lobe of lung Great improvement since last visit with significant improvement in CXR. No indications for additional treatment.  -     DG Chest 2 View; Future  COPD mixed type (HCC) Stop symbicort and start Breztri as prescribed. 3 samples provided in office today. Albuterol as needed for rescue inhaler. Red flags discussed in detail. Report new or worsening symptoms.  -     Budeson-Glycopyrrol-Formoterol (BREZTRI AEROSPHERE) 160-9-4.8 MCG/ACT AERO; Inhale 2 puffs into the lungs 2 (two) times daily.     Continue all other maintenance medications.  Follow up plan: Return if symptoms worsen or fail to improve.   Continue healthy lifestyle choices, including diet (rich in fruits, vegetables, and lean proteins, and low in salt and simple carbohydrates) and exercise (at least 30 minutes of moderate physical activity daily).  Educational handout given for COPD  The above assessment and management plan was discussed with the patient. The patient verbalized understanding of and has agreed to the management plan. Patient is aware to call the clinic if they develop any new symptoms or if symptoms persist or worsen. Patient is aware when to return to the clinic for a follow-up visit. Patient educated on when it is appropriate to go to the emergency  department.   Monia Pouch, FNP-C [facility] [phone]

## 2022-10-02 ENCOUNTER — Encounter: Payer: Medicare PPO | Admitting: Family Medicine

## 2022-11-13 ENCOUNTER — Encounter: Payer: Self-pay | Admitting: Family Medicine

## 2022-11-13 ENCOUNTER — Other Ambulatory Visit: Payer: Self-pay | Admitting: Family Medicine

## 2022-11-13 ENCOUNTER — Ambulatory Visit (INDEPENDENT_AMBULATORY_CARE_PROVIDER_SITE_OTHER): Payer: Medicare PPO | Admitting: Family Medicine

## 2022-11-13 VITALS — BP 129/76 | HR 65 | Temp 97.6°F | Ht 67.0 in | Wt 286.6 lb

## 2022-11-13 DIAGNOSIS — Z114 Encounter for screening for human immunodeficiency virus [HIV]: Secondary | ICD-10-CM | POA: Diagnosis not present

## 2022-11-13 DIAGNOSIS — Z1159 Encounter for screening for other viral diseases: Secondary | ICD-10-CM

## 2022-11-13 DIAGNOSIS — R7303 Prediabetes: Secondary | ICD-10-CM

## 2022-11-13 DIAGNOSIS — Z0000 Encounter for general adult medical examination without abnormal findings: Secondary | ICD-10-CM

## 2022-11-13 DIAGNOSIS — Z87891 Personal history of nicotine dependence: Secondary | ICD-10-CM | POA: Insufficient documentation

## 2022-11-13 DIAGNOSIS — Z125 Encounter for screening for malignant neoplasm of prostate: Secondary | ICD-10-CM | POA: Diagnosis not present

## 2022-11-13 DIAGNOSIS — I700 Atherosclerosis of aorta: Secondary | ICD-10-CM

## 2022-11-13 DIAGNOSIS — Z0001 Encounter for general adult medical examination with abnormal findings: Secondary | ICD-10-CM | POA: Diagnosis not present

## 2022-11-13 DIAGNOSIS — Z122 Encounter for screening for malignant neoplasm of respiratory organs: Secondary | ICD-10-CM | POA: Diagnosis not present

## 2022-11-13 DIAGNOSIS — E782 Mixed hyperlipidemia: Secondary | ICD-10-CM

## 2022-11-13 DIAGNOSIS — J449 Chronic obstructive pulmonary disease, unspecified: Secondary | ICD-10-CM

## 2022-11-13 DIAGNOSIS — E559 Vitamin D deficiency, unspecified: Secondary | ICD-10-CM

## 2022-11-13 DIAGNOSIS — Z6841 Body Mass Index (BMI) 40.0 and over, adult: Secondary | ICD-10-CM

## 2022-11-13 LAB — BAYER DCA HB A1C WAIVED: HB A1C (BAYER DCA - WAIVED): 6 % — ABNORMAL HIGH (ref 4.8–5.6)

## 2022-11-13 MED ORDER — SIMVASTATIN 40 MG PO TABS: 40.0000 mg | ORAL_TABLET | Freq: Every day | ORAL | 1 refills | Status: DC

## 2022-11-13 MED ORDER — OZEMPIC (0.25 OR 0.5 MG/DOSE) 2 MG/3ML ~~LOC~~ SOPN: 0.2500 mg | PEN_INJECTOR | SUBCUTANEOUS | 3 refills | Status: DC

## 2022-11-13 NOTE — Progress Notes (Signed)
Complete physical exam  Patient: Brian Steele   DOB: 31-Mar-1961   62 y.o. Male  MRN: [id number]  Subjective:    Chief Complaint  Patient presents with   [place] patient    Annual Exam    Brian Steele is a 62 y.o. male who presents today for a complete physical exam. He reports consuming a general diet. The patient does not participate in regular exercise at present. He generally feels well. He reports sleeping well. He does have additional problems to discuss today.   Mixed hyperlipidemia Pt has been taking statin therapy. Denies associated myalgias. He does not follow a strict diet or a regular exercise routine.   COPD mixed type [facility]) He is on [place] and doing well. He has not needed to use his SABA. No increased shortness of breath, sputum production, or fatigue.   Aortic atherosclerosis ([place]) On statin therapy. Advised to take ASA therapy.   Morbid obesity ([place]) He is as active as he can be with his knee osteoarthritis. Does have prediabetes and was on Ozempic in the past with great results. States he needs to have a knee replacement but needs to drop 40 lbs prior to surgery.   Vitamin D deficiency Was on repletion therapy in the past and stopped once levels were back to normal. States he does drink milk with Vit D on a regular basis.  Prediabetes Does not check blood sugars. Has been on Ozempic in the past with great results in sugar and weight reduction. Denies polyuria, polyphagia, or polydipsia.    Most recent fall risk assessment:    11/13/2022    3:08 PM  [place] in the past year? 0     Most recent depression screenings:    11/13/2022    3:08 PM 09/19/2022   11:40 AM  PHQ 2/9 Scores  PHQ - 2 Score 0 0  PHQ- 9 Score 1 1    Vision:Within last year and Dental: No current dental problems and Receives regular dental care  Patient Active Problem List   Diagnosis Date Noted   Former heavy cigarette smoker (20-39 per day) 11/13/2022    Vitamin D deficiency 11/13/2022   COPD mixed type ([place]) 09/19/2022   Aortic atherosclerosis ([place]) 01/12/2022   Prediabetes 01/18/2021   Morbid obesity ([place]) 06/22/2020   Hyperlipidemia    Ulnar neuropathy of left upper extremity 10/05/2016   Sleep apnea 11/12/2015   Chronic pain syndrome 01/24/2015   HEMORRHOIDS, EXTERNAL 10/12/2008   Gastroesophageal reflux disease 10/12/2008   DIARRHEA, FUNCTIONAL 10/12/2008   Past Medical History:  Diagnosis Date   Allergy    Aortic atherosclerosis ([place]) 01/12/2022   Arthritis    Barrett's esophagus    Hyperlipidemia    Obstructive sleep apnea    Prediabetes    Vitamin D deficiency    Past Surgical History:  Procedure Laterality Date   APPENDECTOMY     CARPAL TUNNEL RELEASE     CHOLECYSTECTOMY  2013   KNEE ARTHROSCOPY     meniscal repair   NECK SURGERY     has plates   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   SHOULDER SURGERY Bilateral    Social History   Tobacco Use   Smoking status: Former    Types: Cigarettes    Quit date: 2018    Years since quitting: 6.0   Smokeless tobacco: Never  Substance Use Topics   Alcohol use: Not Currently   Drug  use: Never   Social History   Socioeconomic History   Marital status: Married    Spouse name: Not on file   Number of children: Not on file   Years of education: Not on file   Highest education level: Not on file  Occupational History   Not on file  Tobacco Use   Smoking status: Former    Types: Cigarettes    Quit date: 2018    Years since quitting: 6.0   Smokeless tobacco: Never  Substance and Sexual Activity   Alcohol use: Not Currently   Drug use: Never   Sexual activity: Not on file  Other Topics Concern   Not on file  Social History Narrative   ** Merged History Encounter **       Social Determinants of Health   Financial Resource Strain: Not on file  Food Insecurity: Not on file  Transportation Needs: Not on file  Physical Activity: Not on file  Stress:  Not on file  Social Connections: Not on file  Intimate Partner Violence: Not on file   Family Status  Relation Name Status   Brother  (Not Specified)   Family History  Problem Relation Age of Onset   Hypertension Brother    Hyperlipidemia Brother    Allergies  Allergen Reactions   Bee Venom Swelling   Other Hives    In laundry detergents  In laundry detergents     Sulfa Antibiotics Swelling   Sodium Hypochlorite Rash      Patient Care Team: Baruch Gouty, FNP as PCP - General (Family Medicine)   Outpatient Medications Prior to Visit  Medication Sig   albuterol (VENTOLIN HFA) 108 (90 Base) MCG/ACT inhaler Inhale 2 puffs into the lungs every 4 (four) hours as needed for wheezing or shortness of breath.   aspirin 81 MG EC tablet Take 1 tablet by mouth daily.   Budeson-Glycopyrrol-Formoterol (BREZTRI AEROSPHERE) 160-9-4.8 MCG/ACT AERO Inhale 2 puffs into the lungs 2 (two) times daily.   HYDROcodone-acetaminophen (NORCO/VICODIN) 5-325 MG tablet Take 1 tablet by mouth 2 (two) times daily.   [DISCONTINUED] simvastatin (ZOCOR) 40 MG tablet TAKE 1 TABLET BY MOUTH EVERY DAY   No facility-administered medications prior to visit.    Review of Systems  Constitutional:  Negative for chills, diaphoresis, fever, malaise/fatigue and weight loss.  HENT:  Negative for congestion, ear discharge, ear pain, hearing loss, nosebleeds, sinus pain, sore throat and tinnitus.   Eyes:  Negative for blurred vision, double vision, photophobia, pain, discharge and redness.  Respiratory:  Positive for cough and shortness of breath (minimal and with significant exertion). Negative for hemoptysis, sputum production, wheezing and stridor.   Cardiovascular:  Negative for chest pain, palpitations, orthopnea, claudication, leg swelling and PND.  Gastrointestinal:  Positive for diarrhea. Negative for abdominal pain, blood in stool, constipation, heartburn, melena, nausea and vomiting.  Genitourinary:   Negative for dysuria, flank pain, frequency, hematuria and urgency.  Musculoskeletal:  Positive for joint pain and myalgias. Negative for back pain, falls and neck pain.  Skin:  Negative for itching and rash.  Neurological:  Negative for dizziness, tingling, tremors, sensory change, speech change, focal weakness, seizures, loss of consciousness, weakness and headaches.  Endo/Heme/Allergies:  Negative for environmental allergies and polydipsia. Does not bruise/bleed easily.  Psychiatric/Behavioral:  Negative for hallucinations, memory loss, substance abuse and suicidal ideas. The patient is not nervous/anxious and does not have insomnia.   All other systems reviewed and are negative.  Objective:     BP 129/76   Pulse 65   Temp 97.6 F (36.4 C) (Temporal)   Ht _0  (1.702 m)   Wt 286 lb 9.6 oz (130 kg)   SpO2 97%   BMI 44.89 kg/m  BP Readings from Last 3 Encounters:  11/13/22 129/76  09/19/22 115/64  09/05/22 124/73   Wt Readings from Last 3 Encounters:  11/13/22 286 lb 9.6 oz (130 kg)  09/19/22 281 lb 3.2 oz (127.6 kg)  09/05/22 283 lb 8 oz (128.6 kg)      Physical Exam Vitals and nursing note reviewed.  Constitutional:      General: He is not in acute distress.    Appearance: Normal appearance. He is well-developed and well-groomed. He is morbidly obese. He is not ill-appearing, toxic-appearing or diaphoretic.  HENT:     Head: Normocephalic and atraumatic.     Jaw: There is normal jaw occlusion.     Right Ear: Hearing, tympanic membrane, ear canal and external ear normal.     Left Ear: Hearing, tympanic membrane, ear canal and external ear normal.     Nose: Nose normal.     Mouth/Throat:     Lips: Pink.     Mouth: Mucous membranes are moist.     Pharynx: Oropharynx is clear. Uvula midline.  Eyes:     General: Lids are normal.     Extraocular Movements: Extraocular movements intact.     Conjunctiva/sclera: Conjunctivae normal.     Pupils: Pupils are  equal, round, and reactive to light.  Neck:     Thyroid: No thyroid mass, thyromegaly or thyroid tenderness.     Vascular: No carotid bruit or JVD.     Trachea: Trachea and phonation normal.  Cardiovascular:     Rate and Rhythm: Normal rate and regular rhythm.     Chest Wall: PMI is not displaced.     Pulses: Normal pulses.     Heart sounds: Normal heart sounds. No murmur heard.    No friction rub. No gallop.  Pulmonary:     Effort: Pulmonary effort is normal. No respiratory distress.     Breath sounds: Normal breath sounds. No wheezing.  Abdominal:     General: Abdomen is protuberant. Bowel sounds are normal. There is no distension or abdominal bruit.     Palpations: Abdomen is soft. There is no hepatomegaly or splenomegaly.     Tenderness: There is no abdominal tenderness. There is no right CVA tenderness or left CVA tenderness.     Hernia: No hernia is present.  Musculoskeletal:     Cervical back: Normal range of motion and neck supple.     Right knee: Decreased range of motion. Tenderness present.     Right lower leg: No edema.     Left lower leg: No edema.  Lymphadenopathy:     Cervical: No cervical adenopathy.  Skin:    General: Skin is warm and dry.     Capillary Refill: Capillary refill takes less than 2 seconds.     Coloration: Skin is not cyanotic, jaundiced or pale.     Findings: No rash.  Neurological:     General: No focal deficit present.     Mental Status: He is alert and oriented to person, place, and time.     Sensory: Sensation is intact.     Motor: Motor function is intact.     Coordination: Coordination is intact.     Gait: Gait is intact.  Deep Tendon Reflexes: Reflexes are normal and symmetric.  Psychiatric:        Attention and Perception: Attention and perception normal.        Mood and Affect: Mood and affect normal.        Speech: Speech normal.        Behavior: Behavior normal. Behavior is cooperative.        Thought Content: Thought content  normal.        Cognition and Memory: Cognition and memory normal.        Judgment: Judgment normal.      No results found for any visits on 11/13/22. Last CBC Lab Results  Component Value Date   WBC 8.4 03/20/2022   HGB 14.2 03/20/2022   HCT 41.9 03/20/2022   MCV 85 03/20/2022   MCH 28.7 03/20/2022   RDW 14.3 03/20/2022   PLT 278 40/81/4481   Last metabolic panel Lab Results  Component Value Date   GLUCOSE 104 (H) 03/20/2022   NA 143 03/20/2022   K 4.3 03/20/2022   CL 107 (H) 03/20/2022   CO2 21 03/20/2022   BUN 15 03/20/2022   CREATININE 1.13 03/20/2022   EGFR 74 03/20/2022   CALCIUM 9.1 03/20/2022   PROT 7.2 03/20/2022   ALBUMIN 4.3 03/20/2022   LABGLOB 2.9 03/20/2022   AGRATIO 1.5 03/20/2022   BILITOT 0.7 03/20/2022   ALKPHOS 110 03/20/2022   AST 21 03/20/2022   ALT 25 03/20/2022   Last lipids Lab Results  Component Value Date   CHOL 150 03/20/2022   HDL 43 03/20/2022   LDLCALC 79 03/20/2022   TRIG 160 (H) 03/20/2022   CHOLHDL 3.5 03/20/2022   Last hemoglobin A1c Lab Results  Component Value Date   HGBA1C 5.5 07/24/2021   Last thyroid functions No results found for: "TSH", "T3TOTAL", "T4TOTAL", "THYROIDAB" Last vitamin D Lab Results  Component Value Date   VD25OH 34.1 01/18/2021      Assessment & Plan:    Routine Health Maintenance and Physical Exam  Immunization History  Administered Date(s) Administered   Influenza,inj,Quad PF,6+ Mos 08/03/2015   Influenza,inj,quad, With Preservative 09/21/2014   Influenza-Unspecified 09/21/2014   Moderna Sars-Covid-2 Vaccination 03/10/2020, 04/07/2020, 11/06/2020   Pneumococcal Conjugate-13 10/11/2015    Health Maintenance  Topic Date Due   Lung Cancer Screening  Never done   COVID-19 Vaccine (4 - 2023-24 season) 11/29/2022 (Originally 07/12/2022)   Medicare Annual Wellness ([place])  12/14/2022 (Originally 09/08/1961)   INFLUENZA VACCINE  02/09/2023 (Originally 06/11/2022)   Zoster Vaccines- Shingrix (1 of  2) 02/12/2023 (Originally 02/06/1980)   Hepatitis C Screening  03/21/2023 (Originally 02/06/1979)   COLONOSCOPY (Pts 45-10yr Insurance coverage will need to be confirmed)  02/08/2026   HPV VACCINES  Aged Out   DTaP/Tdap/Td  Discontinued   HIV Screening  Discontinued    Discussed health benefits of physical activity, and encouraged him to engage in regular exercise appropriate for his age and condition.  Problem List Items Addressed This Visit       Cardiovascular and Mediastinum   Aortic atherosclerosis ([facility]   Relevant Medications   simvastatin (ZOCOR) 40 MG tablet   Semaglutide,0.25 or 0.5MG/DOS, (OZEMPIC, 0.25 OR 0.5 MG/DOSE,) 2 MG/3ML SOPN   Other Relevant Orders   CMP14+EGFR   Lipid panel     Respiratory   COPD mixed type ([facility]     Other   Hyperlipidemia   Relevant Medications   simvastatin (ZOCOR) 40 MG tablet   Semaglutide,0.25 or 0.5MG/DOS, (OZEMPIC, 0.25  OR 0.5 MG/DOSE,) 2 MG/3ML SOPN   Other Relevant Orders   CMP14+EGFR   Lipid panel   Morbid obesity ([facility])   Relevant Medications   Semaglutide,0.25 or 0.5MG/DOS, (OZEMPIC, 0.25 OR 0.5 MG/DOSE,) 2 MG/3ML SOPN   Other Relevant Orders   Bayer DCA Hb A1c Waived   CMP14+EGFR   CBC with Differential/Platelet   Lipid panel   Thyroid Panel With TSH   Prediabetes   Relevant Medications   simvastatin (ZOCOR) 40 MG tablet   Semaglutide,0.25 or 0.5MG/DOS, (OZEMPIC, 0.25 OR 0.5 MG/DOSE,) 2 MG/3ML SOPN   Other Relevant Orders   Bayer DCA Hb A1c Waived   CMP14+EGFR   CBC with Differential/Platelet   Lipid panel   Thyroid Panel With TSH   Former heavy cigarette smoker (20-39 per day)   Relevant Orders   CT CHEST LUNG CA SCREEN LOW DOSE W/O CM   Vitamin D deficiency   Relevant Orders   VITAMIN D 25 Hydroxy (Vit-D Deficiency, Fractures)   Other Visit Diagnoses     Annual physical exam    -  Primary   Relevant Orders   Bayer DCA Hb A1c Waived   CMP14+EGFR   CBC with Differential/Platelet   Lipid panel   Thyroid  Panel With TSH   VITAMIN D 25 Hydroxy (Vit-D Deficiency, Fractures)   PSA, total and free   CT CHEST LUNG CA SCREEN LOW DOSE W/O CM   Hepatitis C antibody   HIV Antibody (routine testing w rflx)   Screening for prostate cancer       Relevant Orders   PSA, total and free   Encounter for screening for HIV       Relevant Orders   HIV Antibody (routine testing w rflx)   Encounter for hepatitis C screening test for low risk patient       Relevant Orders   Hepatitis C antibody   Encounter for screening for malignant neoplasm of lung in former smoker who quit in past 15 years with 30 pack year history or greater       Relevant Orders   CT CHEST LUNG CA SCREEN LOW DOSE W/O CM      Return in about 3 months (around 02/12/2023) for prediabetes.   The above assessment and management plan was discussed with the patient. The patient verbalized understanding of and has agreed to the management plan. Patient is aware to call the clinic if they develop any new symptoms or if symptoms fail to improve or worsen. Patient is aware when to return to the clinic for a follow-up visit. Patient educated on when it is appropriate to go to the emergency department.   Monia Pouch, FNP-C [facility] [address] [place], [place] [address] [phone]

## 2022-11-14 ENCOUNTER — Encounter: Payer: Self-pay | Admitting: Family Medicine

## 2022-11-14 LAB — CMP14+EGFR
ALT: 20 IU/L (ref 0–44)
AST: 20 IU/L (ref 0–40)
Albumin/Globulin Ratio: 1.7 (ref 1.2–2.2)
Albumin: 4.6 g/dL (ref 3.9–4.9)
Alkaline Phosphatase: 115 IU/L (ref 44–121)
BUN/Creatinine Ratio: 14 (ref 10–24)
BUN: 16 mg/dL (ref 8–27)
Bilirubin Total: 1 mg/dL (ref 0.0–1.2)
CO2: 24 mmol/L (ref 20–29)
Calcium: 9.4 mg/dL (ref 8.6–10.2)
Chloride: 100 mmol/L (ref 96–106)
Creatinine, Ser: 1.11 mg/dL (ref 0.76–1.27)
Globulin, Total: 2.7 g/dL (ref 1.5–4.5)
Glucose: 73 mg/dL (ref 70–99)
Potassium: 4.2 mmol/L (ref 3.5–5.2)
Sodium: 140 mmol/L (ref 134–144)
Total Protein: 7.3 g/dL (ref 6.0–8.5)
eGFR: 76 mL/min/{1.73_m2} (ref >59)

## 2022-11-14 LAB — THYROID PANEL WITH TSH
Free Thyroxine Index: 2.2 (ref 1.2–4.9)
T3 Uptake Ratio: 30 % (ref 24–39)
T4, Total: 7.3 ug/dL (ref 4.5–12.0)
TSH: 1.25 u[IU]/mL (ref 0.450–4.500)

## 2022-11-14 LAB — LIPID PANEL
Chol/HDL Ratio: 3 ratio (ref 0.0–5.0)
Cholesterol, Total: 154 mg/dL (ref 100–199)
HDL: 52 mg/dL (ref >39)
LDL Chol Calc (NIH): 80 mg/dL (ref 0–99)
Triglycerides: 123 mg/dL (ref 0–149)
VLDL Cholesterol Cal: 22 mg/dL (ref 5–40)

## 2022-11-14 LAB — PSA, TOTAL AND FREE
PSA, Free Pct: 44 %
PSA, Free: 0.44 ng/mL
Prostate Specific Ag, Serum: 1 ng/mL (ref 0.0–4.0)

## 2022-11-14 LAB — CBC WITH DIFFERENTIAL/PLATELET
Basophils Absolute: 0.1 10*3/uL (ref 0.0–0.2)
Basos: 1 %
EOS (ABSOLUTE): 0.5 10*3/uL — ABNORMAL HIGH (ref 0.0–0.4)
Eos: 4 %
Hematocrit: 43.9 % (ref 37.5–51.0)
Hemoglobin: 15 g/dL (ref 13.0–17.7)
Immature Grans (Abs): 0.1 10*3/uL (ref 0.0–0.1)
Immature Granulocytes: 1 %
Lymphocytes Absolute: 2.9 10*3/uL (ref 0.7–3.1)
Lymphs: 22 %
MCH: 28.6 pg (ref 26.6–33.0)
MCHC: 34.2 g/dL (ref 31.5–35.7)
MCV: 84 fL (ref 79–97)
Monocytes Absolute: 0.9 10*3/uL (ref 0.1–0.9)
Monocytes: 7 %
Neutrophils Absolute: 8.7 10*3/uL — ABNORMAL HIGH (ref 1.4–7.0)
Neutrophils: 65 %
Platelets: 260 10*3/uL (ref 150–450)
RBC: 5.24 x10E6/uL (ref 4.14–5.80)
RDW: 13.4 % (ref 11.6–15.4)
WBC: 13.1 10*3/uL — ABNORMAL HIGH (ref 3.4–10.8)

## 2022-11-14 LAB — VITAMIN D 25 HYDROXY (VIT D DEFICIENCY, FRACTURES): Vit D, 25-Hydroxy: 27 ng/mL — ABNORMAL LOW (ref 30.0–100.0)

## 2022-11-14 LAB — HIV ANTIBODY (ROUTINE TESTING W REFLEX): HIV Screen 4th Generation wRfx: NONREACTIVE

## 2022-11-14 LAB — HEPATITIS C ANTIBODY: Hep C Virus Ab: NONREACTIVE

## 2022-11-14 MED ORDER — OZEMPIC (0.25 OR 0.5 MG/DOSE) 2 MG/3ML ~~LOC~~ SOPN: 0.2500 mg | PEN_INJECTOR | SUBCUTANEOUS | 3 refills | Status: DC

## 2022-11-14 NOTE — Addendum Note (Signed)
Addended by: Baruch Gouty on: 11/14/2022 09:53 AM   Modules accepted: Orders

## 2022-11-21 ENCOUNTER — Telehealth: Payer: Self-pay | Admitting: *Deleted

## 2022-11-21 NOTE — Telephone Encounter (Signed)
Brian Steele (Key: [id number]) Rx #: [id number] Ozempic (0.25 or 0.5 MG/DOSE) 2MG /3ML pen-injectors  Sent to plan

## 2022-11-21 NOTE — Telephone Encounter (Signed)
DENIED  You asked for the drug above for your Prediabetes. This is an off-label use that is not medically accepted. The Medicare rule in the Prescription Drug Benefit Manual (Chapter 6, Section 10.6) says a drug must be used for a medically accepted indication (covered use). Off-label use is medically accepted when there is proof in one or more of the drug guides that the drug works for your condition. We look at the two major drug guides (compendia): the [place] and the [place] (AHFS-DI). [employer] has decided that there is no proof in either drug guide that this drug works for your condition.

## 2022-12-02 ENCOUNTER — Other Ambulatory Visit: Payer: Self-pay | Admitting: *Deleted

## 2022-12-02 DIAGNOSIS — Z122 Encounter for screening for malignant neoplasm of respiratory organs: Secondary | ICD-10-CM

## 2022-12-02 DIAGNOSIS — Z87891 Personal history of nicotine dependence: Secondary | ICD-10-CM

## 2022-12-04 ENCOUNTER — Other Ambulatory Visit: Payer: Medicare PPO

## 2022-12-04 ENCOUNTER — Other Ambulatory Visit: Payer: Self-pay | Admitting: *Deleted

## 2022-12-04 ENCOUNTER — Other Ambulatory Visit: Payer: Self-pay

## 2022-12-04 DIAGNOSIS — R899 Unspecified abnormal finding in specimens from other organs, systems and tissues: Secondary | ICD-10-CM

## 2022-12-04 DIAGNOSIS — E782 Mixed hyperlipidemia: Secondary | ICD-10-CM

## 2022-12-05 LAB — CBC WITH DIFFERENTIAL/PLATELET
Basophils Absolute: 0.1 10*3/uL (ref 0.0–0.2)
Basos: 1 %
EOS (ABSOLUTE): 0.5 10*3/uL — ABNORMAL HIGH (ref 0.0–0.4)
Eos: 6 %
Hematocrit: 43.9 % (ref 37.5–51.0)
Hemoglobin: 14.9 g/dL (ref 13.0–17.7)
Immature Grans (Abs): 0 10*3/uL (ref 0.0–0.1)
Immature Granulocytes: 0 %
Lymphocytes Absolute: 2.5 10*3/uL (ref 0.7–3.1)
Lymphs: 29 %
MCH: 29.4 pg (ref 26.6–33.0)
MCHC: 33.9 g/dL (ref 31.5–35.7)
MCV: 87 fL (ref 79–97)
Monocytes Absolute: 0.7 10*3/uL (ref 0.1–0.9)
Monocytes: 8 %
Neutrophils Absolute: 4.9 10*3/uL (ref 1.4–7.0)
Neutrophils: 56 %
Platelets: 277 10*3/uL (ref 150–450)
RBC: 5.06 x10E6/uL (ref 4.14–5.80)
RDW: 13.6 % (ref 11.6–15.4)
WBC: 8.7 10*3/uL (ref 3.4–10.8)

## 2022-12-19 NOTE — Addendum Note (Signed)
Addended by: Baruch Gouty on: 12/19/2022 01:13 PM   Modules accepted: Orders

## 2023-01-07 ENCOUNTER — Ambulatory Visit (HOSPITAL_COMMUNITY): Payer: Medicare PPO

## 2023-01-07 ENCOUNTER — Encounter: Payer: Self-pay | Admitting: Primary Care

## 2023-01-07 ENCOUNTER — Ambulatory Visit (INDEPENDENT_AMBULATORY_CARE_PROVIDER_SITE_OTHER): Payer: Medicare PPO | Admitting: Primary Care

## 2023-01-07 VITALS — Ht 68.0 in | Wt 280.0 lb

## 2023-01-07 DIAGNOSIS — Z87891 Personal history of nicotine dependence: Secondary | ICD-10-CM | POA: Diagnosis not present

## 2023-01-07 DIAGNOSIS — F17211 Nicotine dependence, cigarettes, in remission: Secondary | ICD-10-CM

## 2023-01-07 NOTE — Patient Instructions (Signed)
Thank you for participating in the [facility] Program. It was our pleasure to meet you today. We will call you with the results of your scan within the next few days. Your scan will be assigned a Lung RADS category score by the physicians reading the scans.  This Lung RADS score determines follow up scanning.  See below for description of categories, and follow up screening recommendations. We will be in touch to schedule your follow up screening annually or based on recommendations of our providers. We will fax a copy of your scan results to your Primary Care Physician, or the physician who referred you to the program, to ensure they have the results. Please call the office if you have any questions or concerns regarding your scanning experience or results.  Our office number is [phone]. Please speak with Denise Phelps, RN. , or  Denise Buckner RN, They are  our Lung Cancer Screening RN.'s If They are unavailable when you call, Please leave a message on the voice mail. We will return your call at our earliest convenience.This voice mail is monitored several times a day.  Remember, if your scan is normal, we will scan you annually as long as you continue to meet the criteria for the program. (Age 50-80, Current smoker or smoker who has quit within the last 15 years). If you are a smoker, remember, quitting is the single most powerful action that you can take to decrease your risk of lung cancer and other pulmonary, breathing related problems. We know quitting is hard, and we are here to help.  Please let us know if there is anything we can do to help you meet your goal of quitting. If you are a former smoker, congratulations. We are proud of you! Remain smoke free! Remember you can refer friends or family members through the number above.  We will screen them to make sure they meet criteria for the program. Thank you for helping us take better care of you by  participating in Lung Screening.  You can receive free nicotine replacement therapy ( patches, gum or mints) by calling 1-800-QUIT NOW. Please call so we can get you on the path to becoming  a non-smoker. I know it is hard, but you can do this!  Lung RADS Categories:  Lung RADS 1: no nodules or definitely non-concerning nodules.  Recommendation is for a repeat annual scan in 12 months.  Lung RADS 2:  nodules that are non-concerning in appearance and behavior with a very low likelihood of becoming an active cancer. Recommendation is for a repeat annual scan in 12 months.  Lung RADS 3: nodules that are probably non-concerning , includes nodules with a low likelihood of becoming an active cancer.  Recommendation is for a 6-month repeat screening scan. Often noted after an upper respiratory illness. We will be in touch to make sure you have no questions, and to schedule your 6-month scan.  Lung RADS 4 A: nodules with concerning findings, recommendation is most often for a follow up scan in 3 months or additional testing based on our provider's assessment of the scan. We will be in touch to make sure you have no questions and to schedule the recommended 3 month follow up scan.  Lung RADS 4 B:  indicates findings that are concerning. We will be in touch with you to schedule additional diagnostic testing based on our provider's  assessment of the scan.  Other options for assistance in smoking cessation (   As covered by your insurance benefits)  Hypnosis for smoking cessation  [employer] [phone]  Acupuncture for smoking cessation  [facility] [phone]   

## 2023-01-07 NOTE — Progress Notes (Signed)
Virtual Visit via Telephone Note  I connected with Brian Steele on 01/07/23 at  1:30 PM EST by telephone and verified that I am speaking with the correct person using two identifiers.  Location: Patient: Home Provider: Office    I discussed the limitations, risks, security and privacy concerns of performing an evaluation and management service by telephone and the availability of in person appointments. I also discussed with the patient that there may be a patient responsible charge related to this service. The patient expressed understanding and agreed to proceed.   Shared Decision Making Visit Lung Cancer Screening Program [phone])   Eligibility: Age 62 y.o. Pack Years Smoking History Calculation 60 (# packs/per year x # years smoked) Recent History of coughing up blood  no Unexplained weight loss? no ( >Than 15 pounds within the last 6 months ) Prior History Lung / other cancer no (Diagnosis within the last 5 years already requiring surveillance chest CT Scans). Smoking Status Former Smoker Former Smokers: Years since quit: 7 years  Quit Date: 2017  Visit Components: Discussion included one or more decision making aids. yes Discussion included risk/benefits of screening. yes Discussion included potential follow up diagnostic testing for abnormal scans. yes Discussion included meaning and risk of over diagnosis. yes Discussion included meaning and risk of False Positives. yes Discussion included meaning of total radiation exposure. yes  Counseling Included: Importance of adherence to annual lung cancer LDCT screening. yes Impact of comorbidities on ability to participate in the program. yes Ability and willingness to under diagnostic treatment. yes  Smoking Cessation Counseling: Current Smokers:  Discussed importance of smoking cessation. yes Information about tobacco cessation classes and interventions provided to patient. yes Patient provided with "ticket" for LDCT  Scan. NA Symptomatic Patient. no  Counseling(Intermediate counseling: > three minutes) 99406 Diagnosis Code: Tobacco Use Z72.0 Asymptomatic Patient yes  Counseling (Intermediate counseling: > three minutes counseling) [id number] Former Smokers:  Discussed the importance of maintaining cigarette abstinence. yes Diagnosis Code: Personal History of Nicotine Dependence. [id number] Information about tobacco cessation classes and interventions provided to patient. Yes Patient provided with "ticket" for LDCT Scan. NA Written Order for Lung Cancer Screening with LDCT placed in Epic. Yes (CT Chest Lung Cancer Screening Low Dose W/O CM) [id number] Z12.2-Screening of respiratory organs Z87.891-Personal history of nicotine dependence  I have spent 25 minutes of face to face/ virtual visit   time with Brian Steele discussing the risks and benefits of lung cancer screening. We viewed / discussed a power point together that explained in detail the above noted topics. We paused at intervals to allow for questions to be asked and answered to ensure understanding.We discussed that the single most powerful action that he can take to decrease his risk of developing lung cancer is to quit smoking. We discussed whether or not he is ready to commit to setting a quit date. We discussed options for tools to aid in quitting smoking including nicotine replacement therapy, non-nicotine medications, support groups, Quit Smart classes, and behavior modification. We discussed that often times setting smaller, more achievable goals, such as eliminating 1 cigarette a day for a week and then 2 cigarettes a day for a week can be helpful in slowly decreasing the number of cigarettes smoked. This allows for a sense of accomplishment as well as providing a clinical benefit. I provided  him with smoking cessation  information  with contact information for community resources, classes, free nicotine replacement therapy, and access to mobile apps, text  messaging,  and on-line smoking cessation help. I have also provided him  the office contact information in the event he needs to contact me, or the screening staff. We discussed the time and location of the scan, and that either Brian Glassman RN, Brian Prince, RN  or I will call / send a letter with the results within 24-72 hours of receiving them. The patient verbalized understanding of all of  the above and had no further questions upon leaving the office. They have my contact information in the event they have any further questions.  I spent 3-5 minutes counseling on smoking cessation and the health risks of continued tobacco abuse.  I explained to the patient that there has been a high incidence of coronary artery disease noted on these exams. I explained that this is a non-gated exam therefore degree or severity cannot be determined. This patient is on statin therapy. I have asked the patient to follow-up with their PCP regarding any incidental finding of coronary artery disease and management with diet or medication as their PCP  feels is clinically indicated. The patient verbalized understanding of the above and had no further questions upon completion of the visit.    Martyn Ehrich, NP

## 2023-02-13 ENCOUNTER — Ambulatory Visit (INDEPENDENT_AMBULATORY_CARE_PROVIDER_SITE_OTHER): Payer: Medicare PPO | Admitting: Family Medicine

## 2023-02-13 ENCOUNTER — Encounter: Payer: Self-pay | Admitting: Family Medicine

## 2023-02-13 VITALS — BP 126/74 | HR 78 | Temp 97.8°F | Ht 68.0 in | Wt 288.6 lb

## 2023-02-13 DIAGNOSIS — J069 Acute upper respiratory infection, unspecified: Secondary | ICD-10-CM | POA: Diagnosis not present

## 2023-02-13 DIAGNOSIS — R7303 Prediabetes: Secondary | ICD-10-CM | POA: Diagnosis not present

## 2023-02-13 LAB — BAYER DCA HB A1C WAIVED: HB A1C (BAYER DCA - WAIVED): 5.5 % (ref 4.8–5.6)

## 2023-02-13 MED ORDER — CHLORPHEN-PE-ACETAMINOPHEN 4-10-325 MG PO TABS: 1.0000 | ORAL_TABLET | Freq: Four times a day (QID) | ORAL | 0 refills | Status: DC | PRN

## 2023-02-13 MED ORDER — AMOXICILLIN-POT CLAVULANATE 875-125 MG PO TABS: 1.0000 | ORAL_TABLET | Freq: Two times a day (BID) | ORAL | 0 refills | Status: AC

## 2023-02-13 NOTE — Progress Notes (Signed)
Subjective:  Patient ID: Brian Steele, male    DOB: 24-Dec-1960, 62 y.o.   MRN: [id number]  Patient Care Team: Baruch Gouty, FNP as PCP - General (Family Medicine)   Chief Complaint:  Prediabetes (3 month follow up )   HPI: Brian Steele is a 62 y.o. male presenting on 02/13/2023 for Prediabetes (3 month follow up )   1. Prediabetes Has been cutting back on carbohydrate intake and amount of soda he is drinking. He denies polyuria, polyphagia, or polydipsia. He does not check blood sugars. He was on Ozepmic in the past but this is no longer covered by his insurance for diabetes. He is currently not on any medications.   2. Morbid obesity Has been doing better with dietary changes but does not exercise.   3. URI Pt reports ongoing cough, congestion, rhinorrhea, and sinus pressure for the last 2.5 weeks. Has has been taking OTC medications without relief of symptoms. States the cough is worse at night. No shortness of breath, weakness, chills, fatigue, or syncope.      Relevant past medical, surgical, family, and social history reviewed and updated as indicated.  Allergies and medications reviewed and updated. Data reviewed: Chart in Epic.   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   SHOULDER SURGERY Bilateral     Social History   Socioeconomic History   Marital status: Married    Spouse name: Not on file   Number of children: Not on file   Years of education: Not on file   Highest education level: Not on file  Occupational History   Not on file  Tobacco Use    Smoking status: Former    Packs/day: 2.00    Years: 26.00    Additional pack years: 0.00    Total pack years: 52.00    Types: Cigarettes    Quit date: 2018    Years since quitting: 6.2   Smokeless tobacco: Never  Vaping Use   Vaping Use: Never used  Substance and Sexual Activity   Alcohol use: Not Currently   Drug use: Never   Sexual activity: Yes  Other Topics Concern   Not on file  Social History Narrative   ** Merged History Encounter **       Social Determinants of Health   Financial Resource Strain: Not on file  Food Insecurity: Not on file  Transportation Needs: Not on file  Physical Activity: Not on file  Stress: Not on file  Social Connections: Not on file  Intimate Partner Violence: Not on file    Outpatient Encounter Medications as of 02/13/2023  Medication Sig   albuterol (VENTOLIN HFA) 108 (90 Base) MCG/ACT inhaler Inhale 2 puffs into the lungs every 4 (four) hours as needed for wheezing or shortness of breath.   amoxicillin-clavulanate (AUGMENTIN) 875-125 MG tablet Take 1 tablet by mouth 2 (two) times daily for 10 days.   aspirin 81 MG EC tablet Take  1 tablet by mouth daily.   Budeson-Glycopyrrol-Formoterol (BREZTRI AEROSPHERE) 160-9-4.8 MCG/ACT AERO Inhale 2 puffs into the lungs 2 (two) times daily.   Chlorphen-PE-Acetaminophen 4-10-325 MG TABS Take 1 tablet by mouth every 6 (six) hours as needed.   HYDROcodone-acetaminophen (NORCO/VICODIN) 5-325 MG tablet Take 1 tablet by mouth 2 (two) times daily.   simvastatin (ZOCOR) 40 MG tablet Take 1 tablet (40 mg total) by mouth daily.   VITAMIN D PO Take 2,000 mg by mouth daily.   [DISCONTINUED] Semaglutide,0.25 or 0.5MG /DOS, (OZEMPIC, 0.25 OR 0.5 MG/DOSE,) 2 MG/3ML SOPN Inject 0.25 mg into the skin once a week.   No facility-administered encounter medications on file as of 02/13/2023.    Allergies  Allergen Reactions   Bee Venom Swelling   Other Hives    In laundry detergents   In laundry detergents   Sulfa  Antibiotics Swelling   Sodium Hypochlorite Rash    Review of Systems  Constitutional:  Positive for activity change and appetite change. Negative for chills, diaphoresis, fatigue, fever and unexpected weight change.  HENT:  Positive for congestion, postnasal drip, rhinorrhea, sinus pressure, sinus pain and sore throat. Negative for dental problem, drooling, ear discharge, ear pain, facial swelling, hearing loss, mouth sores, nosebleeds, sneezing, tinnitus, trouble swallowing and voice change.   Eyes: Negative.  Negative for photophobia and visual disturbance.  Respiratory:  Negative for cough, chest tightness and shortness of breath.   Cardiovascular:  Negative for chest pain, palpitations and leg swelling.  Gastrointestinal:  Negative for abdominal pain, blood in stool, constipation, diarrhea, nausea and vomiting.  Endocrine: Negative.  Negative for polydipsia, polyphagia and polyuria.  Genitourinary:  Negative for decreased urine volume, difficulty urinating, dysuria, frequency and urgency.  Musculoskeletal:  Negative for arthralgias and myalgias.  Skin: Negative.   Allergic/Immunologic: Negative.   Neurological:  Positive for headaches. Negative for dizziness, tremors, seizures, syncope, facial asymmetry, speech difficulty, weakness, light-headedness and numbness.  Hematological: Negative.   Psychiatric/Behavioral:  Negative for confusion, hallucinations, sleep disturbance and suicidal ideas.   All other systems reviewed and are negative.       Objective:  BP 126/74   Pulse 78   Temp 97.8 F (36.6 C) (Temporal)   Ht 5\' 8"  (1.727 m)   Wt 288 lb 9.6 oz (130.9 kg)   SpO2 94%   BMI 43.88 kg/m    Wt Readings from Last 3 Encounters:  02/13/23 288 lb 9.6 oz (130.9 kg)  01/07/23 280 lb (127 kg)  11/13/22 286 lb 9.6 oz (130 kg)    Physical Exam Vitals and nursing note reviewed.  Constitutional:      General: He is not in acute distress.    Appearance: Normal appearance. He is  well-developed and well-groomed. He is morbidly obese. He is not ill-appearing, toxic-appearing or diaphoretic.  HENT:     Head: Normocephalic and atraumatic.     Jaw: There is normal jaw occlusion.     Right Ear: Hearing normal. A middle ear effusion is present. Tympanic membrane is not erythematous.     Left Ear: Hearing normal. A middle ear effusion is present. Tympanic membrane is not erythematous.     Nose: Nose normal.     Right Turbinates: Enlarged.     Left Turbinates: Enlarged.     Right Sinus: No maxillary sinus tenderness or frontal sinus tenderness.     Left Sinus: No maxillary sinus tenderness or frontal sinus tenderness.     Mouth/Throat:     Lips: Pink.  Mouth: Mucous membranes are moist.     Pharynx: Oropharynx is clear. Uvula midline. Posterior oropharyngeal erythema present.     Tonsils: No tonsillar exudate or tonsillar abscesses.  Eyes:     General: Lids are normal. Allergic shiner present.     Extraocular Movements: Extraocular movements intact.     Conjunctiva/sclera: Conjunctivae normal.     Pupils: Pupils are equal, round, and reactive to light.  Neck:     Thyroid: No thyroid mass, thyromegaly or thyroid tenderness.     Vascular: No carotid bruit or JVD.     Trachea: Trachea and phonation normal.  Cardiovascular:     Rate and Rhythm: Normal rate and regular rhythm.     Chest Wall: PMI is not displaced.     Pulses: Normal pulses.     Heart sounds: Normal heart sounds. No murmur heard.    No friction rub. No gallop.  Pulmonary:     Effort: Pulmonary effort is normal. No respiratory distress.     Breath sounds: Wheezing present. No rhonchi or rales.  Abdominal:     General: There is no abdominal bruit.     Palpations: There is no hepatomegaly or splenomegaly.  Musculoskeletal:        General: Normal range of motion.     Cervical back: Normal range of motion and neck supple.     Right lower leg: No edema.     Left lower leg: No edema.  Lymphadenopathy:      Cervical: No cervical adenopathy.  Skin:    General: Skin is warm and dry.     Capillary Refill: Capillary refill takes less than 2 seconds.     Coloration: Skin is not cyanotic, jaundiced or pale.     Findings: No rash.  Neurological:     General: No focal deficit present.     Mental Status: He is alert and oriented to person, place, and time.     Sensory: Sensation is intact.     Motor: Motor function is intact.     Coordination: Coordination is intact.     Gait: Gait is intact.     Deep Tendon Reflexes: Reflexes are normal and symmetric.  Psychiatric:        Attention and Perception: Attention and perception normal.        Mood and Affect: Mood and affect normal.        Speech: Speech normal.        Behavior: Behavior normal. Behavior is cooperative.        Thought Content: Thought content normal.        Cognition and Memory: Cognition and memory normal.        Judgment: Judgment normal.     Results for orders placed or performed in visit on 02/13/23  Bayer DCA Hb A1c Waived  Result Value Ref Range   HB A1C (BAYER DCA - WAIVED) 5.5 4.8 - 5.6 %       Pertinent labs & imaging results that were available during my care of the patient were reviewed by me and considered in my medical decision making.  Assessment & Plan:  Brian Steele was seen today for prediabetes.  Diagnoses and all orders for this visit:  Prediabetes A1C 5.5 in office. Continue with lifestyle modifications to keep blood sugar controlled.  -     Bayer DCA Hb A1c Waived  Morbid obesity -     Amb Ref to Medical Weight Management  URI with cough and congestion -  Chlorphen-PE-Acetaminophen 4-10-325 MG TABS; Take 1 tablet by mouth every 6 (six) hours as needed. -     amoxicillin-clavulanate (AUGMENTIN) 875-125 MG tablet; Take 1 tablet by mouth 2 (two) times daily for 10 days.     Continue all other maintenance medications.  Follow up plan: Return in 6 months (on 08/15/2023), or if symptoms worsen or  fail to improve, for chronic follow up scedule AWV.   Continue healthy lifestyle choices, including diet (rich in fruits, vegetables, and lean proteins, and low in salt and simple carbohydrates) and exercise (at least 30 minutes of moderate physical activity daily).  Educational handout given for URI, prediabetes   The above assessment and management plan was discussed with the patient. The patient verbalized understanding of and has agreed to the management plan. Patient is aware to call the clinic if they develop any new symptoms or if symptoms persist or worsen. Patient is aware when to return to the clinic for a follow-up visit. Patient educated on when it is appropriate to go to the emergency department.   Monia Pouch, FNP-C [facility] [phone]

## 2023-02-19 ENCOUNTER — Ambulatory Visit (INDEPENDENT_AMBULATORY_CARE_PROVIDER_SITE_OTHER): Payer: Medicare PPO

## 2023-02-19 VITALS — Ht 68.0 in | Wt 288.0 lb

## 2023-02-19 DIAGNOSIS — Z0000 Encounter for general adult medical examination without abnormal findings: Secondary | ICD-10-CM | POA: Diagnosis not present

## 2023-02-19 NOTE — Progress Notes (Signed)
Subjective:   Brian Steele is a 62 y.o. male who presents for Medicare Annual/Subsequent preventive examination.  I connected with  Brian Steele on 02/19/23 by a audio enabled telemedicine application and verified that I am speaking with the correct person using two identifiers.  Patient Location: Home  Provider Location: Home Office  I discussed the limitations of evaluation and management by telemedicine. The patient expressed understanding and agreed to proceed.  Review of Systems     Cardiac Risk Factors include: male gender;advanced age (>21men, >39 women);dyslipidemia;smoking/ tobacco exposure;sedentary lifestyle;obesity (BMI >30kg/m2)     Objective:    Today's Vitals   02/19/23 0942  Weight: 288 lb (130.6 kg)  Height: 5\' 8"  (1.727 m)   Body mass index is 43.79 kg/m.     02/19/2023    9:55 AM  Advanced Directives  Does Patient Have a Medical Advance Directive? No  Would patient like information on creating a medical advance directive? No - Patient declined    Current Medications (verified) Outpatient Encounter Medications as of 02/19/2023  Medication Sig   albuterol (VENTOLIN HFA) 108 (90 Base) MCG/ACT inhaler Inhale 2 puffs into the lungs every 4 (four) hours as needed for wheezing or shortness of breath.   amoxicillin-clavulanate (AUGMENTIN) 875-125 MG tablet Take 1 tablet by mouth 2 (two) times daily for 10 days.   aspirin 81 MG EC tablet Take 1 tablet by mouth daily.   Budeson-Glycopyrrol-Formoterol (BREZTRI AEROSPHERE) 160-9-4.8 MCG/ACT AERO Inhale 2 puffs into the lungs 2 (two) times daily.   Chlorphen-PE-Acetaminophen 4-10-325 MG TABS Take 1 tablet by mouth every 6 (six) hours as needed.   HYDROcodone-acetaminophen (NORCO/VICODIN) 5-325 MG tablet Take 1 tablet by mouth 2 (two) times daily.   simvastatin (ZOCOR) 40 MG tablet Take 1 tablet (40 mg total) by mouth daily.   VITAMIN D PO Take 2,000 mg by mouth daily.   No facility-administered encounter  medications on file as of 02/19/2023.    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   SHOULDER SURGERY Bilateral    Family History  Problem Relation Age of Onset   Arthritis/Rheumatoid Mother    Diabetes Father    Kidney cancer Father    Hypertension Brother    Hyperlipidemia Brother    Obesity Daughter    Social History   Socioeconomic History   Marital status: Married    Spouse name: Not on file   Number of children: Not on file   Years of education: Not on file   Highest education level: Not on file  Occupational History   Not on file  Tobacco Use   Smoking status: Former    Packs/day: 2.00    Years: 26.00    Additional pack years: 0.00    Total pack years: 52.00    Types: Cigarettes    Quit date: 2018    Years since quitting: 6.2  Smokeless tobacco: Never  Vaping Use   Vaping Use: Never used  Substance and Sexual Activity   Alcohol use: Not Currently   Drug use: Never   Sexual activity: Yes  Other Topics Concern   Not on file  Social History Narrative   ** Merged History Encounter **       Social Determinants of Health   Financial Resource Strain: Low Risk  (02/19/2023)   Overall Financial Resource Strain (CARDIA)    Difficulty of Paying Living Expenses: Not hard at all  Food Insecurity: No Food Insecurity (02/19/2023)   Hunger Vital Sign    Worried About Running Out of Food in the Last Year: Never true    Ran Out of Food in the Last Year: Never true   Transportation Needs: No Transportation Needs (02/19/2023)   PRAPARE - Administrator, Civil Service (Medical): No    Lack of Transportation (Non-Medical): No  Physical Activity: Inactive (02/19/2023)   Exercise Vital Sign    Days of Exercise per Week: 0 days    Minutes of Exercise per Session: 0 min  Stress: No Stress Concern Present (02/19/2023)   [employer] of Occupational Health - Occupational Stress Questionnaire    Feeling of Stress : Not at all  Social Connections: Moderately Isolated (02/19/2023)   Social Connection and Isolation Panel [NHANES]    Frequency of Communication with Friends and Family: More than three times a week    Frequency of Social Gatherings with Friends and Family: Three times a week    Attends Religious Services: Never    Active Member of Clubs or Organizations: No    Attends Engineer, structural: Never    Marital Status: Married    Tobacco Counseling Counseling given: Not Answered   Clinical Intake:  Pre-visit preparation completed: Yes  Pain : No/denies pain  Diabetes: No  How often do you need to have someone help you when you read instructions, pamphlets, or other written materials from your doctor or pharmacy?: 1 - Never  Diabetic?No   Interpreter Needed?: No  Information entered by :: Kandis Fantasia LPN   Activities of Daily Living    02/19/2023    9:55 AM  In your present state of health, do you have any difficulty performing the following activities:  Hearing? 0  Vision? 0  Difficulty concentrating or making decisions? 0  Walking or climbing stairs? 0  Dressing or bathing? 0  Doing errands, shopping? 0  Preparing Food and eating ? N  Using the Toilet? N  In the past six months, have you accidently leaked urine? N  Do you have problems with loss of bowel control? N  Managing your Medications? N  Managing your Finances? N  Housekeeping or managing your Housekeeping? N    Patient Care Team: Sonny Masters, FNP as PCP - General (Family Medicine)  Indicate any recent Medical Services you may have received from other than [facility] providers in the past year (date may be approximate).     Assessment:   This is a routine wellness examination for Brian Steele.  Hearing/Vision screen Hearing Screening - Comments:: Denies hearing difficulties  Vision Screening - Comments::  up to date with routine eye exams with [facility]    Dietary issues and exercise activities discussed: Current Exercise Habits: The patient does not participate in regular exercise at present   Goals Addressed             This Visit's Progress  Increase physical activity        Depression Screen    02/19/2023    9:53 AM 02/13/2023   11:04 AM 11/13/2022    3:08 PM 09/19/2022   11:40 AM 09/05/2022   10:05 AM 08/27/2022   12:33 PM 03/20/2022   10:14 AM  PHQ 2/9 Scores  PHQ - 2 Score 0 0 0 0 0 1 0  PHQ- 9 Score 0 0 1 1 5 2  0    Fall Risk    02/19/2023    9:45 AM 02/13/2023   11:04 AM 11/13/2022    3:08 PM 09/19/2022   11:40 AM 09/05/2022   10:04 AM  Fall Risk   Falls in the past year? 0 0 0 0 0  Number falls in past yr: 0      Injury with Fall? 0      Risk for fall due to : No Fall Risks      Follow up Falls prevention discussed;Education provided;Falls evaluation completed        FALL RISK PREVENTION PERTAINING TO THE HOME:  Any stairs in or around the home? No  If so, are there any without handrails? No  Home free of loose throw rugs in walkways, pet beds, electrical cords, etc? Yes  Adequate lighting in your home to reduce risk of falls? Yes   ASSISTIVE DEVICES UTILIZED TO PREVENT FALLS:  Life alert? No  Use of a cane, walker or w/c? No  Grab bars in the bathroom? Yes  Shower chair or bench in shower? No  Elevated toilet seat or a handicapped toilet? Yes   TIMED UP AND GO:  Was the test performed? No . Telephonic visit   Cognitive Function:        02/19/2023    9:56 AM  6CIT Screen   What Year? 0 points  What month? 0 points  What time? 0 points  Count back from 20 0 points  Months in reverse 0 points  Repeat phrase 0 points  Total Score 0 points    Immunizations Immunization History  Administered Date(s) Administered   Influenza,inj,Quad PF,6+ Mos 08/03/2015   Influenza,inj,quad, With Preservative 09/21/2014   Influenza-Unspecified 09/21/2014   Moderna Sars-Covid-2 Vaccination 03/10/2020, 04/07/2020, 11/06/2020   Pneumococcal Conjugate-13 10/11/2015    TDAP status: Due, Education has been provided regarding the importance of this vaccine. Advised may receive this vaccine at local pharmacy or Health Dept. Aware to provide a copy of the vaccination record if obtained from local pharmacy or Health Dept. Verbalized acceptance and understanding.  Flu Vaccine status: Up to date  Pneumococcal vaccine status: Up to date  Covid-19 vaccine status: Information provided on how to obtain vaccines.   Qualifies for Shingles Vaccine? Yes   Zostavax completed No   Shingrix Completed?: No.    Education has been provided regarding the importance of this vaccine. Patient has been advised to call insurance company to determine out of pocket expense if they have not yet received this vaccine. Advised may also receive vaccine at local pharmacy or Health Dept. Verbalized acceptance and understanding.  Screening Tests Health Maintenance  Topic Date Due   Lung Cancer Screening  Never done   COVID-19 Vaccine (4 - 2023-24 season) 03/01/2023 (Originally 07/12/2022)   Zoster Vaccines- Shingrix (1 of 2) 05/15/2023 (Originally [birth year]1980)   INFLUENZA VACCINE  06/12/2023   Medicare Annual Wellness (AWV)  02/19/2024   COLONOSCOPY (Pts 45-57yrs Insurance coverage will need to be confirmed)  02/08/2026   Hepatitis C  Screening  Completed   HPV VACCINES  Aged Out   DTaP/Tdap/Td  Discontinued   HIV Screening  Discontinued    Health Maintenance  Health Maintenance Due  Topic Date Due    Lung Cancer Screening  Never done    Colorectal cancer screening: Type of screening: Colonoscopy. Completed 02/08/21. Repeat every 5 years  Lung Cancer Screening: (Low Dose CT Chest recommended if Age 32-80 years, 30 pack-year currently smoking OR have quit w/in 15years.) does qualify.   Lung Cancer Screening Referral: scheduled for 03/04/23  Additional Screening:  Hepatitis C Screening: does qualify; Completed 11/13/22  Vision Screening: Recommended annual ophthalmology exams for early detection of glaucoma and other disorders of the eye. Is the patient up to date with their annual eye exam?  Yes  Who is the provider or what is the name of the office in which the patient attends annual eye exams? [facility]([place]) If pt is not established with a provider, would they like to be referred to a provider to establish care? No .   Dental Screening: Recommended annual dental exams for proper oral hygiene  Community Resource Referral / Chronic Care Management: CRR required this visit?  No   CCM required this visit?  No      Plan:     I have personally reviewed and noted the following in the patient's chart:   Medical and social history Use of alcohol, tobacco or illicit drugs  Current medications and supplements including opioid prescriptions. Patient is currently taking opioid prescriptions. Information provided to patient regarding non-opioid alternatives. Patient advised to discuss non-opioid treatment plan with their provider. Functional ability and status Nutritional status Physical activity Advanced directives List of other physicians Hospitalizations, surgeries, and ER visits in previous 12 months Vitals Screenings to include cognitive, depression, and falls Referrals and appointments  In addition, I have reviewed and discussed with patient certain preventive protocols, quality metrics, and best practice recommendations. A written personalized care plan for preventive  services as well as general preventive health recommendations were provided to patient.     Durwin NoraSlade, Lianne Carreto Black[place], [place]   1/61/09604/08/2023   Due to this being a virtual visit, the after visit summary with patients personalized plan was offered to patient via mail or my-chart. Patient would like to access on my-chart  Nurse Notes: No concerns; patient is [facility]

## 2023-02-19 NOTE — Patient Instructions (Signed)
Brian Steele , Thank you for taking time to come for your Medicare Wellness Visit. I appreciate your ongoing commitment to your health goals. Please review the following plan we discussed and let me know if I can assist you in the future.   These are the goals we discussed:  Goals      Increase physical activity        This is a list of the screening recommended for you and due dates:  Health Maintenance  Topic Date Due   Screening for Lung Cancer  Never done   COVID-19 Vaccine (4 - 2023-24 season) 03/01/2023*   Zoster (Shingles) Vaccine (1 of 2) 05/15/2023*   Flu Shot  06/12/2023   Medicare Annual Wellness Visit  02/19/2024   Colon Cancer Screening  02/08/2026   Hepatitis C Screening: USPSTF Recommendation to screen - Ages 18-79 yo.  Completed   HPV Vaccine  Aged Out   DTaP/Tdap/Td vaccine  Discontinued   HIV Screening  Discontinued  *Topic was postponed. The date shown is not the original due date.    Advanced directives: Forms are available if you choose in the future to pursue completion.  This is recommended in order to make sure that your health wishes are honored in the event that you are unable to verbalize them to the provider.    Conditions/risks identified: Aim for 30 minutes of exercise or brisk walking, 6-8 glasses of water, and 5 servings of fruits and vegetables each day.   Next appointment: Follow up in one year for your annual wellness visit   Preventive Care 40-64 Years, Male Preventive care refers to lifestyle choices and visits with your health care provider that can promote health and wellness. What does preventive care include? A yearly physical exam. This is also called an annual well check. Dental exams once or twice a year. Routine eye exams. Ask your health care provider how often you should have your eyes checked. Personal lifestyle choices, including: Daily care of your teeth and gums. Regular physical activity. Eating a healthy diet. Avoiding  tobacco and drug use. Limiting alcohol use. Practicing safe sex. Taking low-dose aspirin every day starting at age 33. What happens during an annual well check? The services and screenings done by your health care provider during your annual well check will depend on your age, overall health, lifestyle risk factors, and family history of disease. Counseling  Your health care provider may ask you questions about your: Alcohol use. Tobacco use. Drug use. Emotional well-being. Home and relationship well-being. Sexual activity. Eating habits. Work and work Astronomer. Screening  You may have the following tests or measurements: Height, weight, and BMI. Blood pressure. Lipid and cholesterol levels. These may be checked every 5 years, or more frequently if you are over 98 years old. Skin check. Lung cancer screening. You may have this screening every year starting at age 33 if you have a 30-pack-year history of smoking and currently smoke or have quit within the past 15 years. Fecal occult blood test (FOBT) of the stool. You may have this test every year starting at age 67. Flexible sigmoidoscopy or colonoscopy. You may have a sigmoidoscopy every 5 years or a colonoscopy every 10 years starting at age 37. Prostate cancer screening. Recommendations will vary depending on your family history and other risks. Hepatitis C blood test. Hepatitis B blood test. Sexually transmitted disease (STD) testing. Diabetes screening. This is done by checking your blood sugar (glucose) after you have not eaten for  a while (fasting). You may have this done every 1-3 years. Discuss your test results, treatment options, and if necessary, the need for more tests with your health care provider. Vaccines  Your health care provider may recommend certain vaccines, such as: Influenza vaccine. This is recommended every year. Tetanus, diphtheria, and acellular pertussis (Tdap, Td) vaccine. You may need a Td booster  every 10 years. Zoster vaccine. You may need this after age 62. Pneumococcal 13-valent conjugate (PCV13) vaccine. You may need this if you have certain conditions and have not been vaccinated. Pneumococcal polysaccharide (PPSV23) vaccine. You may need one or two doses if you smoke cigarettes or if you have certain conditions. Talk to your health care provider about which screenings and vaccines you need and how often you need them. This information is not intended to replace advice given to you by your health care provider. Make sure you discuss any questions you have with your health care provider. Document Released: 11/24/2015 Document Revised: 07/17/2016 Document Reviewed: 08/29/2015 Elsevier Interactive Patient Education  2017 [employer].  Fall Prevention in the Home Falls can cause injuries. They can happen to people of all ages. There are many things you can do to make your home safe and to help prevent falls. What can I do on the outside of my home? Regularly fix the edges of walkways and driveways and fix any cracks. Remove anything that might make you trip as you walk through a door, such as a raised step or threshold. Trim any bushes or trees on the path to your home. Use bright outdoor lighting. Clear any walking paths of anything that might make someone trip, such as rocks or tools. Regularly check to see if handrails are loose or broken. Make sure that both sides of any steps have handrails. Any raised decks and porches should have guardrails on the edges. Have any leaves, snow, or ice cleared regularly. Use sand or salt on walking paths during winter. Clean up any spills in your garage right away. This includes oil or grease spills. What can I do in the bathroom? Use night lights. Install grab bars by the toilet and in the tub and shower. Do not use towel bars as grab bars. Use non-skid mats or decals in the tub or shower. If you need to sit down in the shower, use a  plastic, non-slip stool. Keep the floor dry. Clean up any water that spills on the floor as soon as it happens. Remove soap buildup in the tub or shower regularly. Attach bath mats securely with double-sided non-slip rug tape. Do not have throw rugs and other things on the floor that can make you trip. What can I do in the bedroom? Use night lights. Make sure that you have a light by your bed that is easy to reach. Do not use any sheets or blankets that are too big for your bed. They should not hang down onto the floor. Have a firm chair that has side arms. You can use this for support while you get dressed. Do not have throw rugs and other things on the floor that can make you trip. What can I do in the kitchen? Clean up any spills right away. Avoid walking on wet floors. Keep items that you use a lot in easy-to-reach places. If you need to reach something above you, use a strong step stool that has a grab bar. Keep electrical cords out of the way. Do not use floor polish or wax  that makes floors slippery. If you must use wax, use non-skid floor wax. Do not have throw rugs and other things on the floor that can make you trip. What can I do with my stairs? Do not leave any items on the stairs. Make sure that there are handrails on both sides of the stairs and use them. Fix handrails that are broken or loose. Make sure that handrails are as long as the stairways. Check any carpeting to make sure that it is firmly attached to the stairs. Fix any carpet that is loose or worn. Avoid having throw rugs at the top or bottom of the stairs. If you do have throw rugs, attach them to the floor with carpet tape. Make sure that you have a light switch at the top of the stairs and the bottom of the stairs. If you do not have them, ask someone to add them for you. What else can I do to help prevent falls? Wear shoes that: Do not have high heels. Have rubber bottoms. Are comfortable and fit you  well. Are closed at the toe. Do not wear sandals. If you use a stepladder: Make sure that it is fully opened. Do not climb a closed stepladder. Make sure that both sides of the stepladder are locked into place. Ask someone to hold it for you, if possible. Clearly Javar and make sure that you can see: Any grab bars or handrails. First and last steps. Where the edge of each step is. Use tools that help you move around (mobility aids) if they are needed. These include: Canes. Walkers. Scooters. Crutches. Turn on the lights when you go into a dark area. Replace any light bulbs as soon as they burn out. Set up your furniture so you have a clear path. Avoid moving your furniture around. If any of your floors are uneven, fix them. If there are any pets around you, be aware of where they are. Review your medicines with your doctor. Some medicines can make you feel dizzy. This can increase your chance of falling. Ask your doctor what other things that you can do to help prevent falls. This information is not intended to replace advice given to you by your health care provider. Make sure you discuss any questions you have with your health care provider. Document Released: 08/24/2009 Document Revised: 04/04/2016 Document Reviewed: 12/02/2014 Elsevier Interactive Patient Education  2017 [employer].

## 2023-02-24 LAB — CBC AND DIFFERENTIAL
Hemoglobin: 15.8 (ref 13.5–17.5)
Platelets: 279 10*3/uL (ref 150–400)
WBC: 13.2

## 2023-02-24 LAB — IRON,TIBC AND FERRITIN PANEL
Iron: 54
UIBC: 351

## 2023-02-24 LAB — CBC: RBC: 5.46 — AB (ref 3.87–5.11)

## 2023-02-25 LAB — LIPID PANEL
Cholesterol: 132 (ref 0–200)
HDL: 44 (ref 35–70)
LDL Cholesterol: 71
Triglycerides: 84 (ref 40–160)

## 2023-02-25 LAB — VITAMIN B12: Vitamin B-12: 447

## 2023-02-25 LAB — IRON,TIBC AND FERRITIN PANEL
Ferritin: 69.3
TIBC: 405

## 2023-02-25 LAB — CBC AND DIFFERENTIAL: HCT: 48 (ref 41–53)

## 2023-02-25 LAB — VITAMIN D 25 HYDROXY (VIT D DEFICIENCY, FRACTURES): Vit D, 25-Hydroxy: 33.38

## 2023-02-25 LAB — TSH: TSH: 1.28 (ref 0.41–5.90)

## 2023-03-04 ENCOUNTER — Ambulatory Visit (HOSPITAL_COMMUNITY)
Admission: RE | Admit: 2023-03-04 | Discharge: 2023-03-04 | Disposition: A | Discharge status: Home / Self Care | Payer: Medicare PPO | Source: Ambulatory Visit | Attending: Acute Care | Admitting: Acute Care

## 2023-03-04 DIAGNOSIS — Z87891 Personal history of nicotine dependence: Secondary | ICD-10-CM | POA: Insufficient documentation

## 2023-03-04 DIAGNOSIS — Z122 Encounter for screening for malignant neoplasm of respiratory organs: Secondary | ICD-10-CM | POA: Diagnosis present

## 2023-03-06 ENCOUNTER — Encounter: Payer: Self-pay | Admitting: Family Medicine

## 2023-03-07 ENCOUNTER — Other Ambulatory Visit: Payer: Self-pay

## 2023-03-07 DIAGNOSIS — Z87891 Personal history of nicotine dependence: Secondary | ICD-10-CM

## 2023-03-07 DIAGNOSIS — Z122 Encounter for screening for malignant neoplasm of respiratory organs: Secondary | ICD-10-CM

## 2023-03-11 DIAGNOSIS — Z0289 Encounter for other administrative examinations: Secondary | ICD-10-CM

## 2023-03-12 ENCOUNTER — Encounter (INDEPENDENT_AMBULATORY_CARE_PROVIDER_SITE_OTHER): Payer: Medicare PPO | Admitting: Family Medicine

## 2023-03-20 ENCOUNTER — Ambulatory Visit (INDEPENDENT_AMBULATORY_CARE_PROVIDER_SITE_OTHER): Payer: Medicare PPO | Admitting: Family Medicine

## 2023-03-20 ENCOUNTER — Encounter: Payer: Self-pay | Admitting: Family Medicine

## 2023-03-20 VITALS — BP 127/74 | HR 70 | Temp 97.5°F | Ht 68.0 in | Wt 285.0 lb

## 2023-03-20 DIAGNOSIS — J441 Chronic obstructive pulmonary disease with (acute) exacerbation: Secondary | ICD-10-CM | POA: Diagnosis not present

## 2023-03-20 DIAGNOSIS — J449 Chronic obstructive pulmonary disease, unspecified: Secondary | ICD-10-CM

## 2023-03-20 MED ORDER — GUAIFENESIN ER 600 MG PO TB12: 600.0000 mg | ORAL_TABLET | Freq: Two times a day (BID) | ORAL | 0 refills | Status: AC

## 2023-03-20 MED ORDER — BREZTRI AEROSPHERE 160-9-4.8 MCG/ACT IN AERO: 2.0000 | INHALATION_SPRAY | Freq: Two times a day (BID) | RESPIRATORY_TRACT | 11 refills | Status: DC

## 2023-03-20 MED ORDER — PREDNISONE 20 MG PO TABS: 40.0000 mg | ORAL_TABLET | Freq: Every day | ORAL | 0 refills | Status: AC

## 2023-03-20 MED ORDER — DOXYCYCLINE HYCLATE 100 MG PO TABS
100.0000 mg | ORAL_TABLET | Freq: Two times a day (BID) | ORAL | 0 refills | Status: AC
Start: 2023-03-20 — End: 2023-03-30

## 2023-03-20 NOTE — Progress Notes (Signed)
Subjective:  Patient ID: Brian Steele, male    DOB: 03/28/1961, 62 y.o.   MRN: [id number]  Patient Care Team: Sonny Masters, FNP as PCP - General (Family Medicine) Kizzie Bane as Physician Assistant (Physician Assistant) Verlin Dike., MD as Referring Physician (Sports Medicine)   Chief Complaint:  Cough, Nasal Congestion (/), sneezing, and Shortness of Breath (X 3 weeks - otc night quil )   HPI: Brian Steele is a 62 y.o. male presenting on 03/20/2023 for Cough, Nasal Congestion (/), sneezing, and Shortness of Breath (X 3 weeks - otc night quil )   Cough This is a new problem. Episode onset: 3 weeks ago. The problem has been gradually worsening. The problem occurs constantly. The cough is Productive of purulent sputum. Associated symptoms include chills, a fever, nasal congestion, rhinorrhea and shortness of breath. Pertinent negatives include no chest pain, ear congestion, ear pain, headaches, heartburn, hemoptysis, myalgias, postnasal drip, rash, sore throat, sweats, weight loss or wheezing. Exacerbated by: activity. He has tried a beta-agonist inhaler for the symptoms. The treatment provided no relief. His past medical history is significant for COPD.  Shortness of Breath This is a new problem. Episode onset: 3 weeks ago. The problem occurs constantly. The problem has been gradually worsening. Associated symptoms include a fever, rhinorrhea and sputum production. Pertinent negatives include no abdominal pain, chest pain, claudication, coryza, ear pain, headaches, hemoptysis, leg pain, leg swelling, neck pain, orthopnea, PND, rash, sore throat, swollen glands, syncope, vomiting or wheezing. The symptoms are aggravated by any activity. He has tried beta agonist inhalers for the symptoms. The treatment provided no relief. His past medical history is significant for COPD.    Relevant past medical, surgical, family, and social history reviewed and updated as indicated.   Allergies and medications reviewed and updated. Data reviewed: Chart in Epic.   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   SHOULDER SURGERY Bilateral     Social History   Socioeconomic History   Marital status: Married    Spouse name: Not on file   Number of children: Not on file   Years of education: Not on file   Highest education level: Not on file  Occupational History   Not on file  Tobacco Use   Smoking status: Former    Packs/day: 2.00    Years: 26.00    Additional pack years: 0.00    Total pack years: 52.00    Types: Cigarettes    Quit date: 2018    Years since quitting: 6.3   Smokeless tobacco: Never  Vaping Use   Vaping Use: Never used  Substance and Sexual Activity   Alcohol use: Not Currently   Drug use: Never   Sexual activity: Yes  Other Topics Concern   Not on file  Social History Narrative   ** Merged History Encounter **       Social Determinants of Health   Financial  Resource Strain: Low Risk  (02/19/2023)   Overall Financial Resource Strain (CARDIA)    Difficulty of Paying Living Expenses: Not hard at all  Food Insecurity: No Food Insecurity (02/19/2023)   Hunger Vital Sign    Worried About Running Out of Food in the Last Year: Never true    Ran Out of Food in the Last Year: Never true  Transportation Needs: No Transportation Needs (02/19/2023)   PRAPARE - Administrator, Civil Service (Medical): No    Lack of Transportation (Non-Medical): No  Physical Activity: Inactive (02/19/2023)   Exercise Vital Sign    Days of  Exercise per Week: 0 days    Minutes of Exercise per Session: 0 min  Stress: No Stress Concern Present (02/19/2023)   [employer] of Occupational Health - Occupational Stress Questionnaire    Feeling of Stress : Not at all  Social Connections: Moderately Isolated (02/19/2023)   Social Connection and Isolation Panel [NHANES]    Frequency of Communication with Friends and Family: More than three times a week    Frequency of Social Gatherings with Friends and Family: Three times a week    Attends Religious Services: Never    Active Member of Clubs or Organizations: No    Attends Banker Meetings: Never    Marital Status: Married  Catering manager Violence: Not At Risk (02/19/2023)   Humiliation, Afraid, Rape, and Kick questionnaire    Fear of Current or Ex-Partner: No    Emotionally Abused: No    Physically Abused: No    Sexually Abused: No    Outpatient Encounter Medications as of 03/20/2023  Medication Sig   albuterol (VENTOLIN HFA) 108 (90 Base) MCG/ACT inhaler Inhale 2 puffs into the lungs every 4 (four) hours as needed for wheezing or shortness of breath.   aspirin 81 MG EC tablet Take 1 tablet by mouth daily.   Chlorphen-PE-Acetaminophen 4-10-325 MG TABS Take 1 tablet by mouth every 6 (six) hours as needed.   doxycycline (VIBRA-TABS) 100 MG tablet Take 1 tablet (100 mg total) by mouth 2 (two) times daily for 10 days. 1 po bid   guaiFENesin (MUCINEX) 600 MG 12 hr tablet Take 1 tablet (600 mg total) by mouth 2 (two) times daily for 10 days.   HYDROcodone-acetaminophen (NORCO/VICODIN) 5-325 MG tablet Take 1 tablet by mouth 2 (two) times daily.   predniSONE (DELTASONE) 20 MG tablet Take 2 tablets (40 mg total) by mouth daily with breakfast for 5 days.   simvastatin (ZOCOR) 40 MG tablet Take 1 tablet (40 mg total) by mouth daily.   [DISCONTINUED] Budeson-Glycopyrrol-Formoterol (BREZTRI AEROSPHERE) 160-9-4.8 MCG/ACT AERO Inhale 2 puffs into the lungs 2 (two) times daily.    Budeson-Glycopyrrol-Formoterol (BREZTRI AEROSPHERE) 160-9-4.8 MCG/ACT AERO Inhale 2 puffs into the lungs 2 (two) times daily.   [DISCONTINUED] VITAMIN D PO Take 2,000 mg by mouth daily. (Patient not taking: Reported on 03/20/2023)   No facility-administered encounter medications on file as of 03/20/2023.    Allergies  Allergen Reactions   Bee Venom Swelling   Other Hives    In laundry detergents   In laundry detergents   Sulfa Antibiotics Swelling   Sodium Hypochlorite Rash    Review of Systems  Constitutional:  Positive for activity change, appetite change, chills, fatigue and fever. Negative for diaphoresis, unexpected weight change and weight loss.  HENT:  Positive for congestion and rhinorrhea. Negative for dental problem, drooling, ear discharge, ear pain, facial swelling, hearing loss, mouth sores, nosebleeds,  postnasal drip, sinus pressure, sinus pain, sneezing, sore throat, tinnitus, trouble swallowing and voice change.   Eyes:  Negative for photophobia and visual disturbance.  Respiratory:  Positive for cough, sputum production, chest tightness and shortness of breath. Negative for apnea, hemoptysis, choking, wheezing and stridor.   Cardiovascular:  Negative for chest pain, palpitations, orthopnea, claudication, leg swelling, syncope and PND.  Gastrointestinal:  Negative for abdominal distention, abdominal pain, heartburn and vomiting.  Genitourinary:  Negative for decreased urine volume and difficulty urinating.  Musculoskeletal:  Negative for myalgias and neck pain.  Skin:  Negative for rash.  Neurological:  Negative for dizziness, tremors, seizures, syncope, facial asymmetry, speech difficulty, weakness, light-headedness, numbness and headaches.  All other systems reviewed and are negative.       Objective:  BP 127/74   Pulse 70   Temp (!) 97.5 F (36.4 C) (Temporal)   Ht 5\' 8"  (1.727 m)   Wt 285 lb (129.3 kg)   SpO2 93%   BMI 43.33 kg/m    Wt Readings from Last 3  Encounters:  03/20/23 285 lb (129.3 kg)  02/19/23 288 lb (130.6 kg)  02/13/23 288 lb 9.6 oz (130.9 kg)    Physical Exam Vitals and nursing note reviewed.  Constitutional:      General: He is not in acute distress.    Appearance: Normal appearance. He is well-developed and well-groomed. He is morbidly obese. He is not ill-appearing, toxic-appearing or diaphoretic.  HENT:     Head: Normocephalic and atraumatic.     Jaw: There is normal jaw occlusion.     Right Ear: Hearing, tympanic membrane, ear canal and external ear normal.     Left Ear: Hearing, tympanic membrane, ear canal and external ear normal.     Nose: Nose normal.     Mouth/Throat:     Lips: Pink.     Mouth: Mucous membranes are moist.     Pharynx: Oropharynx is clear. Uvula midline.  Eyes:     General: Lids are normal.     Extraocular Movements: Extraocular movements intact.     Conjunctiva/sclera: Conjunctivae normal.     Pupils: Pupils are equal, round, and reactive to light.  Neck:     Thyroid: No thyroid mass, thyromegaly or thyroid tenderness.     Vascular: No carotid bruit or JVD.     Trachea: Trachea and phonation normal.  Cardiovascular:     Rate and Rhythm: Normal rate and regular rhythm.     Chest Wall: PMI is not displaced.     Pulses: Normal pulses.     Heart sounds: Normal heart sounds. No murmur heard.    No friction rub. No gallop.  Pulmonary:     Effort: Pulmonary effort is normal. Prolonged expiration present. No respiratory distress.     Breath sounds: Wheezing and rhonchi present. No rales.  Abdominal:     General: Bowel sounds are normal. There is no distension or abdominal bruit.     Palpations: Abdomen is soft. There is no hepatomegaly or splenomegaly.     Tenderness: There is no abdominal tenderness. There is no right CVA tenderness or left CVA tenderness.     Hernia: No hernia is present.  Musculoskeletal:        General: Normal range of motion.     Cervical back: Normal range of motion  and neck supple.     Right lower leg: No edema.     Left lower leg: No edema.  Lymphadenopathy:  Cervical: No cervical adenopathy.  Skin:    General: Skin is warm and dry.     Capillary Refill: Capillary refill takes less than 2 seconds.     Coloration: Skin is not cyanotic, jaundiced or pale.     Findings: No rash.  Neurological:     General: No focal deficit present.     Mental Status: He is alert and oriented to person, place, and time.     Sensory: Sensation is intact.     Motor: Motor function is intact.     Coordination: Coordination is intact.     Gait: Gait is intact.     Deep Tendon Reflexes: Reflexes are normal and symmetric.  Psychiatric:        Attention and Perception: Attention and perception normal.        Mood and Affect: Mood and affect normal.        Speech: Speech normal.        Behavior: Behavior normal. Behavior is cooperative.        Thought Content: Thought content normal.        Cognition and Memory: Cognition and memory normal.        Judgment: Judgment normal.     Results for orders placed or performed in visit on 02/13/23  Bayer DCA Hb A1c Waived  Result Value Ref Range   HB A1C (BAYER DCA - WAIVED) 5.5 4.8 - 5.6 %       Pertinent labs & imaging results that were available during my care of the patient were reviewed by me and considered in my medical decision making.  Assessment & Plan:  Brian Steele was seen today for cough, nasal congestion, sneezing and shortness of breath.  Diagnoses and all orders for this visit:  COPD with acute exacerbation (HCC) COPD exacerbation. Has not been using Breztri as prescribed. Instructed on proper use and sample provided in office. Refill sent. Will start prednisone, Muconex, and Doxycycline for COPD exacerbation. Aware of red flags which require visit to ED. Report new, worsening, or persistent symptoms.  -     Budeson-Glycopyrrol-Formoterol (BREZTRI AEROSPHERE) 160-9-4.8 MCG/ACT AERO; Inhale 2 puffs into the  lungs 2 (two) times daily. -     predniSONE (DELTASONE) 20 MG tablet; Take 2 tablets (40 mg total) by mouth daily with breakfast for 5 days. -     guaiFENesin (MUCINEX) 600 MG 12 hr tablet; Take 1 tablet (600 mg total) by mouth 2 (two) times daily for 10 days. -     doxycycline (VIBRA-TABS) 100 MG tablet; Take 1 tablet (100 mg total) by mouth 2 (two) times daily for 10 days. 1 po bid  COPD mixed type (HCC) -     Budeson-Glycopyrrol-Formoterol (BREZTRI AEROSPHERE) 160-9-4.8 MCG/ACT AERO; Inhale 2 puffs into the lungs 2 (two) times daily.     Continue all other maintenance medications.  Follow up plan: Return in about 2 weeks (around 04/03/2023), or if symptoms worsen or fail to improve.   Continue healthy lifestyle choices, including diet (rich in fruits, vegetables, and lean proteins, and low in salt and simple carbohydrates) and exercise (at least 30 minutes of moderate physical activity daily).  Educational handout given for COPD exacerbation   The above assessment and management plan was discussed with the patient. The patient verbalized understanding of and has agreed to the management plan. Patient is aware to call the clinic if they develop any new symptoms or if symptoms persist or worsen. Patient is aware when to return to  the clinic for a follow-up visit. Patient educated on when it is appropriate to go to the emergency department.   Monia Pouch, FNP-C [facility] [phone]

## 2023-04-03 ENCOUNTER — Ambulatory Visit (INDEPENDENT_AMBULATORY_CARE_PROVIDER_SITE_OTHER): Payer: Medicare PPO | Admitting: Family Medicine

## 2023-04-08 ENCOUNTER — Ambulatory Visit: Payer: Medicare PPO | Admitting: Family Medicine

## 2023-04-15 ENCOUNTER — Ambulatory Visit: Payer: Medicare PPO | Admitting: Family Medicine

## 2023-04-16 ENCOUNTER — Ambulatory Visit (INDEPENDENT_AMBULATORY_CARE_PROVIDER_SITE_OTHER): Payer: Medicare PPO | Admitting: Family Medicine

## 2023-04-16 ENCOUNTER — Encounter (INDEPENDENT_AMBULATORY_CARE_PROVIDER_SITE_OTHER): Payer: Self-pay | Admitting: Family Medicine

## 2023-04-16 VITALS — BP 125/64 | HR 67 | Temp 98.0°F | Ht 66.0 in | Wt 288.0 lb

## 2023-04-16 DIAGNOSIS — R0602 Shortness of breath: Secondary | ICD-10-CM | POA: Diagnosis not present

## 2023-04-16 DIAGNOSIS — G8929 Other chronic pain: Secondary | ICD-10-CM

## 2023-04-16 DIAGNOSIS — R5383 Other fatigue: Secondary | ICD-10-CM | POA: Diagnosis not present

## 2023-04-16 DIAGNOSIS — R739 Hyperglycemia, unspecified: Secondary | ICD-10-CM

## 2023-04-16 DIAGNOSIS — E559 Vitamin D deficiency, unspecified: Secondary | ICD-10-CM

## 2023-04-16 DIAGNOSIS — M25512 Pain in left shoulder: Secondary | ICD-10-CM

## 2023-04-16 DIAGNOSIS — G4733 Obstructive sleep apnea (adult) (pediatric): Secondary | ICD-10-CM

## 2023-04-16 DIAGNOSIS — E7800 Pure hypercholesterolemia, unspecified: Secondary | ICD-10-CM | POA: Diagnosis not present

## 2023-04-16 DIAGNOSIS — Z1331 Encounter for screening for depression: Secondary | ICD-10-CM

## 2023-04-16 DIAGNOSIS — Z6841 Body Mass Index (BMI) 40.0 and over, adult: Secondary | ICD-10-CM

## 2023-04-16 NOTE — Progress Notes (Unsigned)
Chief Complaint:   Brian Steele (MR# [id number]) is a 62 y.o. male who presents for evaluation and treatment of Brian and related comorbidities. Current BMI is Body mass index is 46.48 kg/m. Brian Steele has been struggling with his weight for many years and has been unsuccessful in either losing weight, maintaining weight loss, or reaching his healthy weight goal.  Brian Steele is currently in the action stage of change and ready to dedicate time achieving and maintaining a healthier weight. Brian Steele is interested in becoming our patient and working on intensive lifestyle modifications including (but not limited to) diet and exercise for weight loss.  New patient- referred by Brian Steele.  Mentions he is getting a stress test at the end of this month for further evaluation of heart blockages seen on pulmonary workup. Patient previously used phentermine- didn't lose much maybe 10lbs and used Ozempic and didn't lose much. He lives at home with wife Brian Steele who is supportive of him and will be changing how she eats.  He is not anticipating any sabotage. Stopped smoking in 2017.  Desired weight is 190lb- last time he was that weight was 1994.  Change in job.  In the past when he lost weight he did significant portion control.  He is very limited in activity due to his left shoulder. Patient enjoys cooking.  He dislike pasta bazoo.  He mentions he thinks he is a picky eater- doesn't eat any seafood. Skips lunch everyday due to lack of hunger and schedule.   Food Recall: Bowl of cereal in the am- Shredded Wheat or Cocoa Puffs (2.5 cups) and 1.5 cups of milk 2% and drinks milk afterwards.  Feels satisfied.  15-20 minutes later he may eat crackers if there are crackers in the house; just wants to snack (Belvita breakfast crackers).  Next time he eats is dinner.  Dinner is chicken, rice and vegetables.  1 chicken breast, 1 cup rice, 1.5 cups of vegetables.  May have another snack of crackers after dinner. 1 cup  coffee I the am with 1 tbsp creamer and sugar.   Brian Steele's habits were reviewed today and are as follows: His family eats meals together, he thinks his family will eat healthier with him, his desired weight loss is 98 lbs, he started gaining weight in 2003, his heaviest weight ever was 302 pounds, he is a picky eater and doesn't like to eat healthier foods, he skips meals frequently, and he is frequently drinking liquids with calories.  Depression Screen Brian Steele's Food and Mood (modified PHQ-9) score was 2.  Subjective:   1. Other fatigue Brian Steele admits to daytime somnolence and denies waking up still tired. Patient has a history of symptoms of daytime fatigue. Brian Steele generally gets 8 or 9 hours of sleep per night, and states that he has generally restful sleep. Snoring is present. Apneic episodes are present. Epworth Sleepiness Score is 3. EKG-RBBB.  2. SOBOE (shortness of breath on exertion) Brian Steele notes increasing shortness of breath with exercising and seems to be worsening over time with weight gain. He notes getting out of breath sooner with activity than he used to. This has not gotten worse recently. Brian Steele denies shortness of breath at rest or orthopnea.  3. Vitamin D deficiency Patient took OTC vitamin D, and he notes fatigue.  4. Pure hypercholesterolemia Patient is on simvastatin.  Last LDL was at goal at 71, HDL 44, and triglycerides 84.  5. Hyperglycemia Patient is not on medications, and has  a history of prediabetes.  His last A1c 1 year ago was of 5.5 and 2 months ago of 5.5.  6. OSA (obstructive sleep apnea) Patient has CPAP, and uses it nightly.  He was diagnosed approximately 8 years ago.  7. Chronic left shoulder pain Patient has done acupuncture internal stim unit and now takes hydrocodone twice daily as needed.  Assessment/Plan:   1. Other fatigue Brian Steele does feel that his weight is causing his energy to be lower than it should be. Fatigue may be related to Brian, depression  or many other causes. Labs will be ordered, and in the meanwhile, Brian Steele will focus on self care including making healthy food choices, increasing physical activity and focusing on stress reduction.  - EKG 12-Lead  2. SOBOE (shortness of breath on exertion) Brian Steele does feel that he gets out of breath more easily that he used to when he exercises. Brian Steele's shortness of breath appears to be Brian related and exercise induced. He has agreed to work on weight loss and gradually increase exercise to treat his exercise induced shortness of breath. Will continue to monitor closely.  3. Vitamin D deficiency We will repeat fasting labs in 2 months, likely not at goal.  4. Pure hypercholesterolemia We will repeat fasting labs in 2 to 3 months.  5. Hyperglycemia We will check labs today, and we will follow-up at his next appointment.  - Comprehensive metabolic panel - Insulin, random  6. OSA (obstructive sleep apnea) We will follow-up with the patient on replacement CPAP.  7. Chronic left shoulder pain We may need to discuss options for activities given limited when exercise implementation starts.  8. Depression screening Brian Steele had a negative depression screening.   9. 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 I recommend Brian Steele begin the structured treatment plan as follows:  He has agreed to the Category 3 Plan.  Exercise goals: No exercise has been prescribed at this time.   Behavioral modification strategies: increasing lean protein intake, meal planning and cooking strategies, keeping healthy foods in the home, and planning for success.  He was informed of the importance of frequent follow-up visits to maximize his success with intensive lifestyle modifications for his multiple health conditions. He was  informed we would discuss his lab results at his next visit unless there is a critical issue that needs to be addressed sooner. Brian Steele agreed to keep his next visit at the agreed upon time to discuss these results.  Objective:   Blood pressure 125/64, pulse 67, temperature 98 F (36.7 C), height 5\' 6"  (1.676 m), weight 288 lb (130.6 kg), SpO2 95 %. Body mass index is 46.48 kg/m.  EKG: Normal sinus rhythm, rate 64 BPM.  Indirect Calorimeter completed today shows a VO2 of 298 and a REE of 2059.  His calculated basal metabolic rate is 1610 thus his basal metabolic rate is worse than expected.  General: Cooperative, alert, well developed, in no acute distress. HEENT: Conjunctivae and lids unremarkable. Cardiovascular: Regular rhythm.  Lungs: Normal work of breathing. Neurologic: No focal deficits.   Lab Results  Component Value Date   CREATININE 1.03 04/16/2023   BUN 12 04/16/2023   NA 142 04/16/2023   K 4.2 04/16/2023   CL 104 04/16/2023   CO2 21 04/16/2023   Lab Results  Component Value Date   ALT 22 04/16/2023   AST 24 04/16/2023   ALKPHOS 109 04/16/2023   BILITOT 0.8 04/16/2023   Lab Results  Component Value Date   HGBA1C 5.5 02/13/2023   HGBA1C 6.0 (H) 11/13/2022   HGBA1C 5.5 07/24/2021   HGBA1C 6.1 01/18/2021   Lab Results  Component Value Date   INSULIN 12.5 04/16/2023   Lab Results  Component Value Date   TSH 1.28 02/24/2023   Lab Results  Component Value Date   CHOL 132 02/24/2023   HDL 44 02/24/2023   LDLCALC 71 02/24/2023   TRIG 84 02/24/2023   CHOLHDL 3.0 11/13/2022   Lab Results  Component Value Date   WBC 13.2 02/24/2023   HGB 15.8 02/24/2023   HCT 48 02/24/2023   MCV 87 12/04/2022   PLT 279 02/24/2023   Lab Results  Component Value Date   IRON 54 02/24/2023   TIBC 405.00 02/24/2023   Brian Steele 69.30 02/24/2023   Attestation Statements:   Reviewed by clinician on day of visit: allergies, medications, problem list, medical history,  surgical history, family history, social history, and previous encounter notes.   I, Burt Knack, am acting as transcriptionist for Reuben Likes, MD. This is the patient's first visit at [facility]. The patient's NEW PATIENT PACKET was reviewed at length. Included in the packet: current and past health history, medications, allergies, ROS, gynecologic history (women only), surgical history, family history, social history, weight history, weight loss surgery history (for those that have had weight loss surgery), nutritional evaluation, mood and food questionnaire, PHQ9, Epworth questionnaire, sleep habits questionnaire, patient life and health improvement goals questionnaire. These will all be scanned into the patient's chart under media.   During the visit, I independently reviewed the patient's EKG, bioimpedance scale results, and indirect calorimeter results. I used this information to tailor a meal plan for the patient that will help him to lose weight and will improve his Brian-related conditions going forward. I performed a medically necessary appropriate examination and/or evaluation. I discussed the assessment and treatment plan with the patient. The patient was provided an opportunity to ask questions and all were answered. The patient agreed with the plan and demonstrated an understanding of the instructions. Labs were ordered at this visit and will be reviewed at the next visit unless more critical results need to be addressed immediately. Clinical information was updated and documented in the EMR.    I have reviewed the above documentation for accuracy and completeness, and I agree with the above. - Reuben Likes, MD

## 2023-04-17 LAB — COMPREHENSIVE METABOLIC PANEL
Albumin/Globulin Ratio: 1.6 (ref 1.2–2.2)
Bilirubin Total: 0.8 mg/dL (ref 0.0–1.2)
Chloride: 104 mmol/L (ref 96–106)
Creatinine, Ser: 1.03 mg/dL (ref 0.76–1.27)
Total Protein: 7.4 g/dL (ref 6.0–8.5)
eGFR: 82 mL/min/{1.73_m2} (ref >59)

## 2023-04-17 LAB — INSULIN, RANDOM

## 2023-04-18 LAB — COMPREHENSIVE METABOLIC PANEL
ALT: 22 IU/L (ref 0–44)
AST: 24 IU/L (ref 0–40)
Albumin: 4.5 g/dL (ref 3.9–4.9)
Alkaline Phosphatase: 109 IU/L (ref 44–121)
BUN/Creatinine Ratio: 12 (ref 10–24)
BUN: 12 mg/dL (ref 8–27)
CO2: 21 mmol/L (ref 20–29)
Calcium: 8.8 mg/dL (ref 8.6–10.2)
Globulin, Total: 2.9 g/dL (ref 1.5–4.5)
Glucose: 81 mg/dL (ref 70–99)
Potassium: 4.2 mmol/L (ref 3.5–5.2)
Sodium: 142 mmol/L (ref 134–144)

## 2023-04-30 ENCOUNTER — Ambulatory Visit (INDEPENDENT_AMBULATORY_CARE_PROVIDER_SITE_OTHER): Payer: Medicare PPO | Admitting: Family Medicine

## 2023-04-30 ENCOUNTER — Encounter (INDEPENDENT_AMBULATORY_CARE_PROVIDER_SITE_OTHER): Payer: Self-pay | Admitting: Family Medicine

## 2023-04-30 VITALS — BP 121/64 | HR 67 | Temp 97.7°F | Ht 66.0 in | Wt 282.0 lb

## 2023-04-30 DIAGNOSIS — E7849 Other hyperlipidemia: Secondary | ICD-10-CM | POA: Diagnosis not present

## 2023-04-30 DIAGNOSIS — Z6841 Body Mass Index (BMI) 40.0 and over, adult: Secondary | ICD-10-CM

## 2023-04-30 DIAGNOSIS — E669 Obesity, unspecified: Secondary | ICD-10-CM | POA: Diagnosis not present

## 2023-04-30 DIAGNOSIS — R7303 Prediabetes: Secondary | ICD-10-CM | POA: Diagnosis not present

## 2023-04-30 DIAGNOSIS — E559 Vitamin D deficiency, unspecified: Secondary | ICD-10-CM | POA: Diagnosis not present

## 2023-04-30 MED ORDER — VITAMIN D (ERGOCALCIFEROL) 1.25 MG (50000 UNIT) PO CAPS
50000.0000 [IU] | ORAL_CAPSULE | ORAL | 0 refills | Status: DC
Start: 2023-04-30 — End: 2023-06-04

## 2023-04-30 NOTE — Progress Notes (Unsigned)
Chief Complaint:   OBESITY Brian Steele is here to discuss his progress with his obesity treatment plan along with follow-up of his obesity related diagnoses. Brian Steele is on the Category 3 Plan and states he is following his eating plan approximately 75% of the time. Brian Steele states he is doing yard work 3 hours 5 times per week.  Today's visit was #: 2 Starting weight: 288 lb Starting date: 04/16/2023 Today's weight: 282 lb Today's date: 04/30/2023 Total lbs lost to date: 6 lb Total lbs lost since last in-office visit: 6 lb  Interim History: Patient felt the first few weeks weren't bad.  He didn't like some of the snack options.  Otherwise he felt he stuck with the plan pretty consistently.  He couldn't eat all the food on plan for all the meals.  Every meal he struggled to get all the food in. He wants to substitute something possibly for eggs daily. He is wondering if he could do cereal for breakfast.   Subjective:   1. Other hyperlipidemia Discussed labs with patient today. Patient is on Zocor 40 mg daily.  Patient denies transaminitis or myalgias.    2. Vitamin D deficiency Patient is not on prescription Vitamin D.  Patient is positive for fatigue.    3. Prediabetes A1c 5.5, (previously 6.0).  Patient is not on any medications and has minimal carb cravings.    Assessment/Plan:   1. Other hyperlipidemia Continue Zocor, no change in dose or medication.   2. Vitamin D deficiency Refill - Vitamin D, Ergocalciferol, (DRISDOL) 1.25 MG (50000 UNIT) CAPS capsule; Take 1 capsule (50,000 Units total) by mouth every 7 (seven) days.  Dispense: 4 capsule; Refill: 0  3. Prediabetes Pathophysiology of insulin resistance, prediabetes, diabetes discussed with patient today. Some times not getting all food in but otherwise keeping macro ratios close to goal.   4. BMI 45.0-49.9, adult (HCC)  5. Obesity with starting BMI of 46.4 Brian Steele is currently in the action stage of change. As such, his goal is  to continue with weight loss efforts. He has agreed to the Category 3 Plan.   Exercise goals: All adults should avoid inactivity. Some physical activity is better than none, and adults who participate in any amount of physical activity gain some health benefits.  Behavioral modification strategies: increasing lean protein intake, meal planning and cooking strategies, keeping healthy foods in the home, and planning for success.  Brian Steele has agreed to follow-up with our clinic in 2 weeks. He was informed of the importance of frequent follow-up visits to maximize his success with intensive lifestyle modifications for his multiple health conditions.   Objective:   Blood pressure 121/64, pulse 67, temperature 97.7 F (36.5 C), height 5\' 6"  (1.676 m), weight 282 lb (127.9 kg), SpO2 93 %. Body mass index is 45.52 kg/m.  General: Cooperative, alert, well developed, in no acute distress. HEENT: Conjunctivae and lids unremarkable. Cardiovascular: Regular rhythm.  Lungs: Normal work of breathing. Neurologic: No focal deficits.   Lab Results  Component Value Date   CREATININE 1.03 04/16/2023   BUN 12 04/16/2023   NA 142 04/16/2023   K 4.2 04/16/2023   CL 104 04/16/2023   CO2 21 04/16/2023   Lab Results  Component Value Date   ALT 22 04/16/2023   AST 24 04/16/2023   ALKPHOS 109 04/16/2023   BILITOT 0.8 04/16/2023   Lab Results  Component Value Date   HGBA1C 5.5 02/13/2023   HGBA1C 6.0 (H) 11/13/2022  HGBA1C 5.5 07/24/2021   HGBA1C 6.1 01/18/2021   Lab Results  Component Value Date   INSULIN 12.5 04/16/2023   Lab Results  Component Value Date   TSH 1.28 02/24/2023   Lab Results  Component Value Date   CHOL 132 02/24/2023   HDL 44 02/24/2023   LDLCALC 71 02/24/2023   TRIG 84 02/24/2023   CHOLHDL 3.0 11/13/2022   Lab Results  Component Value Date   VD25OH 33.38 02/24/2023   VD25OH 27.0 (L) 11/13/2022   VD25OH 34.1 01/18/2021   Lab Results  Component Value Date   WBC  13.2 02/24/2023   HGB 15.8 02/24/2023   HCT 48 02/24/2023   MCV 87 12/04/2022   PLT 279 02/24/2023   Lab Results  Component Value Date   IRON 54 02/24/2023   TIBC 405.00 02/24/2023   FERRITIN 69.30 02/24/2023   Attestation Statements:   Reviewed by clinician on day of visit: allergies, medications, problem list, medical history, surgical history, family history, social history, and previous encounter notes.  I, Brian Steele, RMA, am acting as transcriptionist for Reuben Likes, MD.  I have reviewed the above documentation for accuracy and completeness, and I agree with the above. - Reuben Likes, MD

## 2023-05-08 ENCOUNTER — Telehealth (INDEPENDENT_AMBULATORY_CARE_PROVIDER_SITE_OTHER): Payer: Self-pay | Admitting: *Deleted

## 2023-05-08 NOTE — Telephone Encounter (Signed)
Left patient message to call back. Per Dr. Cathey Endow we need to reschedule patients appointment. Can reschedule with Dr. Marquis Lunch.

## 2023-05-11 ENCOUNTER — Other Ambulatory Visit: Payer: Self-pay | Admitting: Family Medicine

## 2023-05-11 DIAGNOSIS — E782 Mixed hyperlipidemia: Secondary | ICD-10-CM

## 2023-05-11 DIAGNOSIS — R7303 Prediabetes: Secondary | ICD-10-CM

## 2023-05-11 DIAGNOSIS — I700 Atherosclerosis of aorta: Secondary | ICD-10-CM

## 2023-05-13 ENCOUNTER — Ambulatory Visit (INDEPENDENT_AMBULATORY_CARE_PROVIDER_SITE_OTHER): Payer: Medicare PPO | Admitting: Family Medicine

## 2023-05-13 ENCOUNTER — Encounter (INDEPENDENT_AMBULATORY_CARE_PROVIDER_SITE_OTHER): Payer: Self-pay | Admitting: Family Medicine

## 2023-05-13 VITALS — BP 130/75 | HR 78 | Temp 98.2°F | Ht 66.0 in | Wt 283.0 lb

## 2023-05-13 DIAGNOSIS — M1711 Unilateral primary osteoarthritis, right knee: Secondary | ICD-10-CM | POA: Diagnosis not present

## 2023-05-13 DIAGNOSIS — Z6841 Body Mass Index (BMI) 40.0 and over, adult: Secondary | ICD-10-CM | POA: Diagnosis not present

## 2023-05-13 DIAGNOSIS — E559 Vitamin D deficiency, unspecified: Secondary | ICD-10-CM | POA: Diagnosis not present

## 2023-05-13 NOTE — Progress Notes (Unsigned)
Office: [phone]  /  Fax: [phone]  WEIGHT SUMMARY AND BIOMETRICS  Starting Date: 04/16/23  Starting Weight: 288lb   Weight Lost Since Last Visit: 0lb   Vitals Temp: 98.2 F (36.8 C) BP: 130/75 Pulse Rate: 78 SpO2: 96 %   Body Composition  Body Fat %: 40.1 % Fat Mass (lbs): 113.4 lbs Muscle Mass (lbs): 161.2 lbs Total Body Water (lbs): 127 lbs Visceral Fat Rating : 29     HPI  Chief Complaint: OBESITY  Brian Steele is here to discuss his progress with his obesity treatment plan. He is on the the Category 3 Plan and states he is following his eating plan approximately 90-95 % of the time. He states he is exercising 0 minutes 0 times per week.   Interval History:  Since last office visit he is up 1 lb He and his wife had their 7 year grandson and he ate off plan He was more physically active He is eating 3 meals has rare snacking He has sweet tea and sugar in coffee but has been trying some ZERO sugar sodas He is up 1.8 lb of muscle mass and is down 1.6 lb of body fat He has a pool but is not using it.  His wife is considering getting an exercise  Pharmacotherapy: none  PHYSICAL EXAM:  Blood pressure 130/75, pulse 78, temperature 98.2 F (36.8 C), height 5\' 6"  (1.676 m), weight 283 lb (128.4 kg), SpO2 96 %. Body mass index is 45.68 kg/m.  General: He is overweight, cooperative, alert, well developed, and in no acute distress. PSYCH: Has normal mood, affect and thought process.   Lungs: Normal breathing effort, no conversational dyspnea.   ASSESSMENT AND PLAN  TREATMENT PLAN FOR OBESITY:  Recommended Dietary Goals  Ardi is currently in the action stage of change. As such, his goal is to continue weight management plan. He has agreed to the Category 3 Plan.  Behavioral Intervention  We discussed the following Behavioral Modification Strategies today: increasing lean protein intake, decreasing simple carbohydrates , increasing vegetables, increasing  lower glycemic fruits, increasing water intake, keeping healthy foods at home, work on managing stress, creating time for self-care and relaxation measures, avoiding temptations and identifying enticing environmental cues, continue to practice mindfulness when eating, and planning for success.  Additional resources provided today: NA  Recommended Physical Activity Goals  Torion has been advised to work up to 150 minutes of moderate intensity aerobic activity a week and strengthening exercises 2-3 times per week for cardiovascular health, weight loss maintenance and preservation of muscle mass.   He has agreed to Think about ways to increase daily physical activity and overcoming barriers to exercise  Pharmacotherapy changes for the treatment of obesity: none  ASSOCIATED CONDITIONS ADDRESSED TODAY  There are no diagnoses linked to this encounter.    He was informed of the importance of frequent follow up visits to maximize his success with intensive lifestyle modifications for his multiple health conditions.   ATTESTASTION STATEMENTS:  Reviewed by clinician on day of visit: allergies, medications, problem list, medical history, surgical history, family history, social history, and previous encounter notes pertinent to obesity diagnosis.   I have personally spent 30 minutes total time today in preparation, patient care, nutritional counseling and documentation for this visit, including the following: review of clinical lab tests; review of medical tests/procedures/services.      Godfrey Pick Free, CMA DABFM, DABOM [facility] [address] [place], [place] [address] [phone]

## 2023-05-14 DIAGNOSIS — M1711 Unilateral primary osteoarthritis, right knee: Secondary | ICD-10-CM | POA: Insufficient documentation

## 2023-05-14 NOTE — Assessment & Plan Note (Signed)
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 His wife is here for support today. His goal is to get BMI reduction for R TKR but this has not been enough motivation to effect his eating habits. He lacks insurance coverage for AOMs He has a net weight loss of 5 lb in 4 weeks of medically supervised weight management.  Encouraged improved compliance on meal plan, reducing frequency of meals out and thinking about what he is willing to add in for exercise 3 days/ wk

## 2023-05-14 NOTE — Assessment & Plan Note (Signed)
Last vitamin D Lab Results  Component Value Date   VD25OH 33.38 02/24/2023   He is currently on RX ergocalciferol weekly Energy level is stable Denies adverse SE  Recheck level in the next month

## 2023-05-14 NOTE — Assessment & Plan Note (Signed)
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  He is working on getting his BMI <40 for R TKR. He has access to a pool but has not been using it.  Other options for exercise include a nearby [employer] or an exercise bike at home (they no longer have one but wife is considering getting a new one).    Encouraged him to follow thru with plans to add in 20 min of physical activity daily, even using resistance bands or free weights at home. Continue active plan for weight reduction.

## 2023-05-21 ENCOUNTER — Other Ambulatory Visit (INDEPENDENT_AMBULATORY_CARE_PROVIDER_SITE_OTHER): Payer: Self-pay | Admitting: Family Medicine

## 2023-05-21 DIAGNOSIS — E559 Vitamin D deficiency, unspecified: Secondary | ICD-10-CM

## 2023-06-04 ENCOUNTER — Ambulatory Visit (INDEPENDENT_AMBULATORY_CARE_PROVIDER_SITE_OTHER): Payer: Medicare PPO | Admitting: Family Medicine

## 2023-06-04 ENCOUNTER — Encounter (INDEPENDENT_AMBULATORY_CARE_PROVIDER_SITE_OTHER): Payer: Self-pay | Admitting: Family Medicine

## 2023-06-04 VITALS — BP 122/71 | HR 66 | Temp 98.2°F | Ht 66.0 in | Wt 278.0 lb

## 2023-06-04 DIAGNOSIS — E669 Obesity, unspecified: Secondary | ICD-10-CM | POA: Diagnosis not present

## 2023-06-04 DIAGNOSIS — E559 Vitamin D deficiency, unspecified: Secondary | ICD-10-CM | POA: Diagnosis not present

## 2023-06-04 DIAGNOSIS — Z6841 Body Mass Index (BMI) 40.0 and over, adult: Secondary | ICD-10-CM

## 2023-06-04 DIAGNOSIS — E7849 Other hyperlipidemia: Secondary | ICD-10-CM

## 2023-06-04 MED ORDER — VITAMIN D (ERGOCALCIFEROL) 1.25 MG (50000 UNIT) PO CAPS: 50000.0000 [IU] | ORAL_CAPSULE | ORAL | 0 refills | Status: DC

## 2023-06-04 NOTE — Progress Notes (Signed)
Chief Complaint:   OBESITY Brian Steele is here to discuss his progress with his obesity treatment plan along with follow-up of his obesity related diagnoses. Brian Steele is on the Category 3 Plan and states he is following his eating plan approximately 80% of the time. Brian Steele states he is working outside for 10 minutes 3 times per week, and in the pool for 60 minutes 1 time per week.  Today's visit was #: 4 Starting weight: 288 lbs Starting date: 04/16/2023 Today's weight: 278 lbs Today's date: 06/04/2023 Total lbs lost to date: 10 Total lbs lost since last in-office visit: 5  Interim History: Patient had a busy last few weeks- his grandson was with him for a week.  Patient has been very controlled in his snacking at night.  He tried the corepower shakes and felt it was actually ok.  Next few weeks he is celebrating his anniversary with his wife.  Next month he is going to [place] to the bed and breakfast.  Denies any changes needing to be made to the meal plan for the next few weeks.  Patient is keeping eye on BMI of 40 to get a knee replacement.   Subjective:   1. Vitamin D deficiency Patient denies nausea, vomiting, or muscle weakness but notes fatigue.  His last vitamin D level was of 33.3.  2. Other hyperlipidemia Patient is on Zocor daily, with no side effects noted.  Assessment/Plan:   1. Vitamin D deficiency Patient will continue prescription vitamin D once weekly, and we will refill for 1 month.  - Vitamin D, Ergocalciferol, (DRISDOL) 1.25 MG (50000 UNIT) CAPS capsule; Take 1 capsule (50,000 Units total) by mouth every 7 (seven) days.  Dispense: 4 capsule; Refill: 0  2. Other hyperlipidemia Patient will continue Zocor with no change in medication or dose.  3. BMI 40.0-44.9, adult (HCC)  4. Obesity with starting BMI of 46.4 Pranay is currently in the action stage of change. As such, his goal is to continue with weight loss efforts. He has agreed to the Category 3 Plan.    Exercise goals: All adults should avoid inactivity. Some physical activity is better than none, and adults who participate in any amount of physical activity gain some health benefits.  Behavioral modification strategies: increasing lean protein intake, meal planning and cooking strategies, keeping healthy foods in the home, and planning for success.  Brian Steele has agreed to follow-up with our clinic in 3 weeks. He was informed of the importance of frequent follow-up visits to maximize his success with intensive lifestyle modifications for his multiple health conditions.   Objective:   Blood pressure 122/71, pulse 66, temperature 98.2 F (36.8 C), height 5\' 6"  (1.676 m), weight 278 lb (126.1 kg), SpO2 95%. Body mass index is 44.87 kg/m.  General: Cooperative, alert, well developed, in no acute distress. HEENT: Conjunctivae and lids unremarkable. Cardiovascular: Regular rhythm.  Lungs: Normal work of breathing. Neurologic: No focal deficits.   Lab Results  Component Value Date   CREATININE 1.03 04/16/2023   BUN 12 04/16/2023   NA 142 04/16/2023   K 4.2 04/16/2023   CL 104 04/16/2023   CO2 21 04/16/2023   Lab Results  Component Value Date   ALT 22 04/16/2023   AST 24 04/16/2023   ALKPHOS 109 04/16/2023   BILITOT 0.8 04/16/2023   Lab Results  Component Value Date   HGBA1C 5.5 02/13/2023   HGBA1C 6.0 (H) 11/13/2022   HGBA1C 5.5 07/24/2021   HGBA1C 6.1  01/18/2021   Lab Results  Component Value Date   INSULIN 12.5 04/16/2023   Lab Results  Component Value Date   TSH 1.28 02/25/2023   Lab Results  Component Value Date   CHOL 132 02/25/2023   HDL 44 02/25/2023   LDLCALC 71 02/25/2023   TRIG 84 02/25/2023   CHOLHDL 3.0 11/13/2022   Lab Results  Component Value Date   VD25OH 33.38 02/25/2023   VD25OH 27.0 (L) 11/13/2022   VD25OH 34.1 01/18/2021   Lab Results  Component Value Date   WBC 13.2 02/25/2023   HGB 15.8 02/25/2023   HCT 48 02/25/2023   MCV 87  12/04/2022   PLT 279 02/25/2023   Lab Results  Component Value Date   IRON 54 02/25/2023   TIBC 405.00 02/25/2023   FERRITIN 69.30 02/25/2023   Attestation Statements:   Reviewed by clinician on day of visit: allergies, medications, problem list, medical history, surgical history, family history, social history, and previous encounter notes.   I, Burt Knack, am acting as transcriptionist for Reuben Likes, MD.  I have reviewed the above documentation for accuracy and completeness, and I agree with the above. - Reuben Likes, MD

## 2023-06-10 ENCOUNTER — Other Ambulatory Visit (HOSPITAL_COMMUNITY): Payer: Self-pay | Admitting: Family Medicine

## 2023-06-10 ENCOUNTER — Other Ambulatory Visit (HOSPITAL_COMMUNITY): Payer: Self-pay | Admitting: *Deleted

## 2023-06-10 ENCOUNTER — Encounter (HOSPITAL_COMMUNITY): Payer: Self-pay

## 2023-06-10 DIAGNOSIS — R0789 Other chest pain: Secondary | ICD-10-CM

## 2023-06-10 MED ORDER — METOPROLOL TARTRATE 50 MG PO TABS: ORAL_TABLET | ORAL | 0 refills | Status: DC

## 2023-06-11 ENCOUNTER — Telehealth (HOSPITAL_COMMUNITY): Payer: Self-pay | Admitting: *Deleted

## 2023-06-11 ENCOUNTER — Ambulatory Visit (INDEPENDENT_AMBULATORY_CARE_PROVIDER_SITE_OTHER): Payer: Medicare PPO | Admitting: Family Medicine

## 2023-06-11 NOTE — Telephone Encounter (Signed)
Attempted to call patient regarding upcoming cardiac CT appointment. Left message with wife for patient to view instructions in his mychart and to call back if there are questions.  Larey Brick RN Navigator Cardiac Imaging [facility] [phone] Office [phone] Cell

## 2023-06-11 NOTE — Telephone Encounter (Signed)
Patient returning call about his upcoming cardiac imaging study; pt verbalizes understanding of appt date/time, parking situation and where to check in, pre-test NPO status and medications ordered, and verified current allergies; name and call back number provided for further questions should they arise  Larey Brick RN Navigator Cardiac Imaging [facility] [phone] office [phone] cell  Patient to take 50mg  metoprolol tartrate two hours prior to his cardiac CT scan.  He is aware to arrive at 10:30 AM.

## 2023-06-12 ENCOUNTER — Ambulatory Visit (HOSPITAL_COMMUNITY)
Admission: RE | Admit: 2023-06-12 | Discharge: 2023-06-12 | Disposition: A | Discharge status: Home / Self Care | Payer: Medicare PPO | Source: Ambulatory Visit | Attending: Family Medicine | Admitting: Family Medicine

## 2023-06-12 DIAGNOSIS — I2510 Atherosclerotic heart disease of native coronary artery without angina pectoris: Secondary | ICD-10-CM | POA: Diagnosis not present

## 2023-06-12 DIAGNOSIS — I700 Atherosclerosis of aorta: Secondary | ICD-10-CM | POA: Diagnosis not present

## 2023-06-12 DIAGNOSIS — K760 Fatty (change of) liver, not elsewhere classified: Secondary | ICD-10-CM | POA: Insufficient documentation

## 2023-06-12 DIAGNOSIS — R0789 Other chest pain: Secondary | ICD-10-CM | POA: Insufficient documentation

## 2023-06-12 MED ORDER — NITROGLYCERIN 0.4 MG SL SUBL
SUBLINGUAL_TABLET | SUBLINGUAL | Status: AC
  Filled 2023-06-12: qty 2

## 2023-06-12 MED ORDER — NITROGLYCERIN 0.4 MG SL SUBL
0.8000 mg | SUBLINGUAL_TABLET | SUBLINGUAL | Status: DC | PRN
  Administered 2023-06-12: 0.8 mg via SUBLINGUAL

## 2023-06-12 MED ORDER — IOHEXOL 350 MG/ML SOLN
95.0000 mL | Freq: Once | INTRAVENOUS | Status: AC | PRN
  Administered 2023-06-12: 95 mL via INTRAVENOUS

## 2023-06-24 ENCOUNTER — Encounter (INDEPENDENT_AMBULATORY_CARE_PROVIDER_SITE_OTHER): Payer: Self-pay | Admitting: Family Medicine

## 2023-06-24 ENCOUNTER — Ambulatory Visit (INDEPENDENT_AMBULATORY_CARE_PROVIDER_SITE_OTHER): Payer: Medicare HMO | Admitting: Family Medicine

## 2023-06-24 VITALS — BP 120/66 | HR 63 | Temp 98.2°F | Ht 66.0 in | Wt 281.0 lb

## 2023-06-24 DIAGNOSIS — E669 Obesity, unspecified: Secondary | ICD-10-CM | POA: Diagnosis not present

## 2023-06-24 DIAGNOSIS — Z6841 Body Mass Index (BMI) 40.0 and over, adult: Secondary | ICD-10-CM

## 2023-06-24 DIAGNOSIS — E559 Vitamin D deficiency, unspecified: Secondary | ICD-10-CM

## 2023-06-24 DIAGNOSIS — I2584 Coronary atherosclerosis due to calcified coronary lesion: Secondary | ICD-10-CM

## 2023-06-24 DIAGNOSIS — I2510 Atherosclerotic heart disease of native coronary artery without angina pectoris: Secondary | ICD-10-CM | POA: Diagnosis not present

## 2023-06-24 MED ORDER — WEGOVY 0.25 MG/0.5ML ~~LOC~~ SOAJ
0.2500 mg | SUBCUTANEOUS | 0 refills | Status: DC
Start: 2023-06-24 — End: 2023-07-30

## 2023-06-24 MED ORDER — VITAMIN D (ERGOCALCIFEROL) 1.25 MG (50000 UNIT) PO CAPS
50000.0000 [IU] | ORAL_CAPSULE | ORAL | 0 refills | Status: DC
Start: 2023-06-24 — End: 2023-07-17

## 2023-06-24 NOTE — Progress Notes (Signed)
Chief Complaint:   OBESITY Brian Steele is here to discuss his progress with his obesity treatment plan along with follow-up of his obesity related diagnoses. [name] is on the Category 3 Plan and states he is following his eating plan approximately 80% of the time. [name] states he is walking for 10 minutes 3 times per week.  Today's visit was #: 5 Starting weight: 288 lbs Starting date: 04/16/2023 Today's weight: 281 lbs Today's date: 06/24/2023 Total lbs lost to date: 7 Total lbs lost since last in-office visit: 0  Interim History: Since last appointment he took a trip to see friends in [facility] and has been very limited in his activity due to the excessive rain.  At the end of the month he is going to [facility] and his exercise will increase significantly at that time. Foodwise he is being more mindful and on par with his meal plan.  He is getting all the food on the plan in and is substituting premier shakes for a meal in case he skips a meal. He is getting preportioned bags of nuts for snacks that are normally around 130 calories.  He is eating watermelon and grapes.   Subjective:   1. Coronary artery disease due to calcified coronary lesion Patient's recent cardiac CT was showing CAD of 79.5 or 59% (minimal to moderate).  2. Vitamin D deficiency Patient denies nausea, vomiting, or muscle weakness but notes fatigue.  His last vitamin D level was of 33.38.  Assessment/Plan:   1. Coronary artery disease due to calcified coronary lesion Patient agreed to start Wegovy 0.25 mg subcu weekly with no refills.  - Semaglutide-Weight Management (WEGOVY) 0.25 MG/0.5ML SOAJ; Inject 0.25 mg into the skin once a week.  Dispense: 2 mL; Refill: 0  2. Vitamin D deficiency Patient will continue prescription vitamin D once weekly, we will refill for 1 month.  - Vitamin D, Ergocalciferol, (DRISDOL) 1.25 MG (50000 UNIT) CAPS capsule; Take 1 capsule (50,000 Units total) by mouth every 7 (seven) days.   Dispense: 4 capsule; Refill: 0  3. BMI 40.0-44.9, adult (HCC)  4. Obesity with starting BMI of 46.4 Brian Steele is currently in the action stage of change. As such, his goal is to continue with weight loss efforts. He has agreed to the Category 3 Plan.   Exercise goals: All adults should avoid inactivity. Some physical activity is better than none, and adults who participate in any amount of physical activity gain some health benefits.  Behavioral modification strategies: increasing lean protein intake, meal planning and cooking strategies, keeping healthy foods in the home, and planning for success.  [name] has agreed to follow-up with our clinic in 3 weeks. He was informed of the importance of frequent follow-up visits to maximize his success with intensive lifestyle modifications for his multiple health conditions.   Objective:   Blood pressure 120/66, pulse 63, temperature 98.2 F (36.8 C), height 5\' 6"  (1.676 m), weight 281 lb (127.5 kg), SpO2 94%. Body mass index is 45.35 kg/m.  General: Cooperative, alert, well developed, in no acute distress. HEENT: Conjunctivae and lids unremarkable. Cardiovascular: Regular rhythm.  Lungs: Normal work of breathing. Neurologic: No focal deficits.   Lab Results  Component Value Date   CREATININE 1.03 04/16/2023   BUN 12 04/16/2023   NA 142 04/16/2023   K 4.2 04/16/2023   CL 104 04/16/2023   CO2 21 04/16/2023   Lab Results  Component Value Date   ALT 22 04/16/2023   AST 24  04/16/2023   ALKPHOS 109 04/16/2023   BILITOT 0.8 04/16/2023   Lab Results  Component Value Date   HGBA1C 5.5 02/13/2023   HGBA1C 6.0 (H) 11/13/2022   HGBA1C 5.5 07/24/2021   HGBA1C 6.1 01/18/2021   Lab Results  Component Value Date   INSULIN 12.5 04/16/2023   Lab Results  Component Value Date   TSH 1.28 02/25/2023   Lab Results  Component Value Date   CHOL 132 02/25/2023   HDL 44 02/25/2023   LDLCALC 71 02/25/2023   TRIG 84 02/25/2023   CHOLHDL 3.0  11/13/2022   Lab Results  Component Value Date   VD25OH 33.38 02/25/2023   VD25OH 27.0 (L) 11/13/2022   VD25OH 34.1 01/18/2021   Lab Results  Component Value Date   WBC 13.2 02/25/2023   HGB 15.8 02/25/2023   HCT 48 02/25/2023   MCV 87 12/04/2022   PLT 279 02/25/2023   Lab Results  Component Value Date   IRON 54 02/25/2023   TIBC 405.00 02/25/2023   FERRITIN 69.30 02/25/2023   Attestation Statements:   Reviewed by clinician on day of visit: allergies, medications, problem list, medical history, surgical history, family history, social history, and previous encounter notes.   I, Burt Knack, am acting as transcriptionist for Reuben Likes, MD.  I have reviewed the above documentation for accuracy and completeness, and I agree with the above. - Reuben Likes, MD

## 2023-06-25 ENCOUNTER — Encounter (INDEPENDENT_AMBULATORY_CARE_PROVIDER_SITE_OTHER): Payer: Self-pay

## 2023-07-17 ENCOUNTER — Encounter (INDEPENDENT_AMBULATORY_CARE_PROVIDER_SITE_OTHER): Payer: Self-pay | Admitting: Family Medicine

## 2023-07-17 ENCOUNTER — Ambulatory Visit (INDEPENDENT_AMBULATORY_CARE_PROVIDER_SITE_OTHER): Payer: Medicare HMO | Admitting: Family Medicine

## 2023-07-17 VITALS — BP 127/73 | HR 71 | Temp 97.6°F | Ht 66.0 in | Wt 282.0 lb

## 2023-07-17 DIAGNOSIS — F32A Depression, unspecified: Secondary | ICD-10-CM | POA: Insufficient documentation

## 2023-07-17 DIAGNOSIS — E559 Vitamin D deficiency, unspecified: Secondary | ICD-10-CM

## 2023-07-17 DIAGNOSIS — F5089 Other specified eating disorder: Secondary | ICD-10-CM | POA: Diagnosis not present

## 2023-07-17 DIAGNOSIS — Z6841 Body Mass Index (BMI) 40.0 and over, adult: Secondary | ICD-10-CM | POA: Diagnosis not present

## 2023-07-17 DIAGNOSIS — F3289 Other specified depressive episodes: Secondary | ICD-10-CM

## 2023-07-17 DIAGNOSIS — E669 Obesity, unspecified: Secondary | ICD-10-CM | POA: Diagnosis not present

## 2023-07-17 MED ORDER — BUPROPION HCL ER (SR) 150 MG PO TB12: 150.0000 mg | ORAL_TABLET | Freq: Every morning | ORAL | 0 refills | Status: DC

## 2023-07-17 MED ORDER — VITAMIN D (ERGOCALCIFEROL) 1.25 MG (50000 UNIT) PO CAPS
50000.0000 [IU] | ORAL_CAPSULE | ORAL | 0 refills | Status: DC
Start: 2023-07-17 — End: 2023-07-30

## 2023-07-17 NOTE — Progress Notes (Signed)
Chief Complaint:   OBESITY Brian Steele is here to discuss his progress with his obesity treatment plan along with follow-up of his obesity related diagnoses. Hong is on the Category 3 Plan and states he is following his eating plan approximately 75% of the time. Dax states he is doing 0 minutes 0 times per week.  Today's visit was #: 6 Starting weight: 288 lbs Starting date: 04/16/2023 Today's weight: 282 lbs Today's date: 07/17/2023 Total lbs lost to date: 6 Total lbs lost since last in-office visit: 0  Interim History: Patient has been struggling to follow his plan and to eat everything. He was prescribed Wegovy, but his insurance would not cover.   Subjective:   1. Vitamin D deficiency Patient is on Vitamin D with no side effects noted. He needs a refill today.   2. Emotional Eating Behavior Patient is struggling more with cravings than hunger. He is open to looking at medication treatment options.   Assessment/Plan:   1. Vitamin D deficiency Patient will continue prescription Vitamin D 50,000 IU once weekly, and we will refill for 1 month.   - Vitamin D, Ergocalciferol, (DRISDOL) 1.25 MG (50000 UNIT) CAPS capsule; Take 1 capsule (50,000 Units total) by mouth every 7 (seven) days.  Dispense: 4 capsule; Refill: 0  2. Emotional Eating Behavior Patient agreed to start Wellbutrin SR 150 mg every morning #30 with no refills. We will follow up at his next visit.   3. BMI 45.0-49.9, adult (HCC)  4. Obesity, Beginning BMI 46.48 Brian Steele is currently in the action stage of change. As such, his goal is to continue with weight loss efforts. He has agreed to the Category 3 Plan.   Behavioral modification strategies: increasing lean protein intake and no skipping meals.  [name] has agreed to follow-up with our clinic in 2 to 3 weeks. He was informed of the importance of frequent follow-up visits to maximize his success with intensive lifestyle modifications for his multiple health conditions.    Objective:   Blood pressure 127/73, pulse 71, temperature 97.6 F (36.4 C), height 5\' 6"  (1.676 m), weight 282 lb (127.9 kg), SpO2 95%. Body mass index is 45.52 kg/m.  Lab Results  Component Value Date   CREATININE 1.03 04/16/2023   BUN 12 04/16/2023   NA 142 04/16/2023   K 4.2 04/16/2023   CL 104 04/16/2023   CO2 21 04/16/2023   Lab Results  Component Value Date   ALT 22 04/16/2023   AST 24 04/16/2023   ALKPHOS 109 04/16/2023   BILITOT 0.8 04/16/2023   Lab Results  Component Value Date   HGBA1C 5.5 02/13/2023   HGBA1C 6.0 (H) 11/13/2022   HGBA1C 5.5 07/24/2021   HGBA1C 6.1 01/18/2021   Lab Results  Component Value Date   INSULIN 12.5 04/16/2023   Lab Results  Component Value Date   TSH 1.28 02/25/2023   Lab Results  Component Value Date   CHOL 132 02/25/2023   HDL 44 02/25/2023   LDLCALC 71 02/25/2023   TRIG 84 02/25/2023   CHOLHDL 3.0 11/13/2022   Lab Results  Component Value Date   VD25OH 33.38 02/25/2023   VD25OH 27.0 (L) 11/13/2022   VD25OH 34.1 01/18/2021   Lab Results  Component Value Date   WBC 13.2 02/25/2023   HGB 15.8 02/25/2023   HCT 48 02/25/2023   MCV 87 12/04/2022   PLT 279 02/25/2023   Lab Results  Component Value Date   IRON 54 02/25/2023   TIBC  405.00 02/25/2023   FERRITIN 69.30 02/25/2023   Attestation Statements:   Reviewed by clinician on day of visit: allergies, medications, problem list, medical history, surgical history, family history, social history, and previous encounter notes.   I, Burt Knack, am acting as transcriptionist for Quillian Quince, MD.  I have reviewed the above documentation for accuracy and completeness, and I agree with the above. -  Quillian Quince, MD

## 2023-07-30 ENCOUNTER — Ambulatory Visit (INDEPENDENT_AMBULATORY_CARE_PROVIDER_SITE_OTHER): Payer: Medicare HMO | Admitting: Family Medicine

## 2023-07-30 ENCOUNTER — Encounter (INDEPENDENT_AMBULATORY_CARE_PROVIDER_SITE_OTHER): Payer: Self-pay | Admitting: Family Medicine

## 2023-07-30 VITALS — BP 148/74 | HR 74 | Ht 66.0 in | Wt 277.0 lb

## 2023-07-30 DIAGNOSIS — F3289 Other specified depressive episodes: Secondary | ICD-10-CM | POA: Diagnosis not present

## 2023-07-30 DIAGNOSIS — E559 Vitamin D deficiency, unspecified: Secondary | ICD-10-CM | POA: Diagnosis not present

## 2023-07-30 DIAGNOSIS — E669 Obesity, unspecified: Secondary | ICD-10-CM | POA: Diagnosis not present

## 2023-07-30 DIAGNOSIS — I158 Other secondary hypertension: Secondary | ICD-10-CM | POA: Diagnosis not present

## 2023-07-30 DIAGNOSIS — Z6841 Body Mass Index (BMI) 40.0 and over, adult: Secondary | ICD-10-CM

## 2023-07-30 MED ORDER — VITAMIN D (ERGOCALCIFEROL) 1.25 MG (50000 UNIT) PO CAPS
50000.0000 [IU] | ORAL_CAPSULE | ORAL | 0 refills | Status: DC
Start: 2023-07-30 — End: 2023-08-20

## 2023-07-30 MED ORDER — BUPROPION HCL ER (SR) 150 MG PO TB12
150.0000 mg | ORAL_TABLET | Freq: Every morning | ORAL | 0 refills | Status: DC
Start: 2023-07-30 — End: 2023-08-20

## 2023-07-31 NOTE — Progress Notes (Signed)
Chief Complaint:   OBESITY Brian Steele is here to discuss his progress with his obesity treatment plan along with follow-up of his obesity related diagnoses. Brian Steele is on the Category 3 Plan and states he is following his eating plan approximately 75-80% of the time. Brian Steele states he is walking for 60 minutes 5 times per week.  Today's visit was #: 7 Starting weight: 288 lbs Starting date: 04/16/2023 Today's weight: 277 lbs Today's date: 07/30/2023 Total lbs lost to date: 10 Total lbs lost since last in-office visit: 5  Interim History: Patient is doing well with his weight loss.  His hunger has improved and he is working on eating all of his protein.  Subjective:   1. Other secondary hypertension Patient's blood pressure is elevated today, but normally well-controlled.  He thinks this is related to traffic.  2. Vitamin D deficiency Patient is stable on vitamin D with no side effects noted.  3. Emotional Eating Behavior Patient is doing well with decreasing emotional eating behavior on Wellbutrin, and he feels he is able to follow his eating plan better.  His blood pressure is mildly elevated today however.  Assessment/Plan:   1. Other secondary hypertension Patient will continue with his diet and exercise, and we will recheck his blood pressure at his next visit in 2 to 3 weeks.  2. Vitamin D deficiency Patient will continue once weekly prescription vitamin D, and we will refill for 1 month.  - Vitamin D, Ergocalciferol, (DRISDOL) 1.25 MG (50000 UNIT) CAPS capsule; Take 1 capsule (50,000 Units total) by mouth every 7 (seven) days.  Dispense: 4 capsule; Refill: 0  3. Emotional Eating Behavior Patient will continue Wellbutrin SR 150 mg once daily, and we will refill for 1 month.  We will recheck his blood pressure at his next visit in 2 to 3 weeks.  - buPROPion (WELLBUTRIN SR) 150 MG 12 hr tablet; Take 1 tablet (150 mg total) by mouth every morning.  Dispense: 30 tablet; Refill:  0  4. BMI 40.0-44.9, adult (HCC)  5. Obesity, Beginning BMI 46.48 Brian Steele is currently in the action stage of change. As such, his goal is to continue with weight loss efforts. He has agreed to the Category 3 Plan.   We will recheck fasting labs at his next visit.   Exercise goals: Start chair yoga for 5-10 minutes daily.   Behavioral modification strategies: increasing lean protein intake.  Brian Steele has agreed to follow-up with our clinic in 2 to 3 weeks. He was informed of the importance of frequent follow-up visits to maximize his success with intensive lifestyle modifications for his multiple health conditions.   Objective:   Blood pressure (!) 148/74, pulse 74, height 5\' 6"  (1.676 m), weight 277 lb (125.6 kg), SpO2 95%. Body mass index is 44.71 kg/m.  Lab Results  Component Value Date   CREATININE 1.03 04/16/2023   BUN 12 04/16/2023   NA 142 04/16/2023   K 4.2 04/16/2023   CL 104 04/16/2023   CO2 21 04/16/2023   Lab Results  Component Value Date   ALT 22 04/16/2023   AST 24 04/16/2023   ALKPHOS 109 04/16/2023   BILITOT 0.8 04/16/2023   Lab Results  Component Value Date   HGBA1C 5.5 02/13/2023   HGBA1C 6.0 (H) 11/13/2022   HGBA1C 5.5 07/24/2021   HGBA1C 6.1 01/18/2021   Lab Results  Component Value Date   INSULIN 12.5 04/16/2023   Lab Results  Component Value Date   TSH 1.28  02/25/2023   Lab Results  Component Value Date   CHOL 132 02/25/2023   HDL 44 02/25/2023   LDLCALC 71 02/25/2023   TRIG 84 02/25/2023   CHOLHDL 3.0 11/13/2022   Lab Results  Component Value Date   VD25OH 33.38 02/25/2023   VD25OH 27.0 (L) 11/13/2022   VD25OH 34.1 01/18/2021   Lab Results  Component Value Date   WBC 13.2 02/25/2023   HGB 15.8 02/25/2023   HCT 48 02/25/2023   MCV 87 12/04/2022   PLT 279 02/25/2023   Lab Results  Component Value Date   IRON 54 02/25/2023   TIBC 405.00 02/25/2023   FERRITIN 69.30 02/25/2023   Attestation Statements:   Reviewed by  clinician on day of visit: allergies, medications, problem list, medical history, surgical history, family history, social history, and previous encounter notes.   I, Burt Knack, am acting as transcriptionist for Quillian Quince, MD.  I have reviewed the above documentation for accuracy and completeness, and I agree with the above. -  Quillian Quince, MD

## 2023-08-15 ENCOUNTER — Ambulatory Visit: Payer: Medicare HMO | Admitting: Family Medicine

## 2023-08-18 ENCOUNTER — Encounter: Payer: Self-pay | Admitting: Family Medicine

## 2023-08-20 ENCOUNTER — Encounter (INDEPENDENT_AMBULATORY_CARE_PROVIDER_SITE_OTHER): Payer: Self-pay | Admitting: Family Medicine

## 2023-08-20 ENCOUNTER — Ambulatory Visit (INDEPENDENT_AMBULATORY_CARE_PROVIDER_SITE_OTHER): Payer: Medicare HMO | Admitting: Family Medicine

## 2023-08-20 VITALS — BP 145/80 | HR 69 | Ht 66.0 in | Wt 274.0 lb

## 2023-08-20 DIAGNOSIS — F3289 Other specified depressive episodes: Secondary | ICD-10-CM

## 2023-08-20 DIAGNOSIS — E559 Vitamin D deficiency, unspecified: Secondary | ICD-10-CM | POA: Diagnosis not present

## 2023-08-20 DIAGNOSIS — E538 Deficiency of other specified B group vitamins: Secondary | ICD-10-CM

## 2023-08-20 DIAGNOSIS — R7303 Prediabetes: Secondary | ICD-10-CM | POA: Diagnosis not present

## 2023-08-20 DIAGNOSIS — E782 Mixed hyperlipidemia: Secondary | ICD-10-CM | POA: Diagnosis not present

## 2023-08-20 DIAGNOSIS — Z6841 Body Mass Index (BMI) 40.0 and over, adult: Secondary | ICD-10-CM

## 2023-08-20 DIAGNOSIS — F5089 Other specified eating disorder: Secondary | ICD-10-CM

## 2023-08-20 DIAGNOSIS — E669 Obesity, unspecified: Secondary | ICD-10-CM

## 2023-08-20 MED ORDER — VITAMIN D (ERGOCALCIFEROL) 1.25 MG (50000 UNIT) PO CAPS
50000.0000 [IU] | ORAL_CAPSULE | ORAL | 0 refills | Status: DC
Start: 2023-08-20 — End: 2023-10-31

## 2023-08-20 MED ORDER — BUPROPION HCL ER (SR) 150 MG PO TB12
150.0000 mg | ORAL_TABLET | Freq: Every morning | ORAL | 0 refills | Status: DC
Start: 2023-08-20 — End: 2023-11-20

## 2023-08-20 NOTE — Progress Notes (Unsigned)
Chief Complaint:   OBESITY Brian Steele is here to discuss his progress with his obesity treatment plan along with follow-up of his obesity related diagnoses. Brian Steele is on the Category 3 Plan and states he is following his eating plan approximately 80% of the time. Brian Steele states he is walking for 30-45 minutes 2-3 times per week.  Today's visit was #: 8 Starting weight: 288 lbs Starting date: 04/16/2023 Today's weight: 274 lbs Today's date: 08/20/2023 Total lbs lost to date: 14 Total lbs lost since last in-office visit: 3  Interim History: Patient is working on his diet and exercise.  His hunger is controlled and he is trying to be more active but his back is still hurting.  Subjective:   1. Vitamin D deficiency Patient is on vitamin D prescription, and he is due for labs.  He denies nausea, vomiting, or muscle weakness.  2. Mixed hyperlipidemia Patient is on Zocor, and he is doing well with his diet and exercise.  He is due for labs.  3. B12 nutritional deficiency Patient's last B12 level was below goal, but he is on a B12 rich diet.  4. Prediabetes Patient is working on decreasing simple carbohydrates to prevent onset of diabetes mellitus.  5. Emotional Eating Behavior Patient is doing well with decreasing emotional eating behavior.  No side effects were noted with his medications.  Assessment/Plan:   1. Vitamin D deficiency We will check labs today, and we will refill prescription vitamin D 50,000 IU once weekly for 1 month.  - Vitamin D, Ergocalciferol, (DRISDOL) 1.25 MG (50000 UNIT) CAPS capsule; Take 1 capsule (50,000 Units total) by mouth every 7 (seven) days.  Dispense: 4 capsule; Refill: 0 - VITAMIN D 25 Hydroxy (Vit-D Deficiency, Fractures)  2. Mixed hyperlipidemia We will check labs today.  Patient will continue with his diet and exercise, and we will follow-up at his next visit.  - Lipid Panel With LDL/HDL Ratio  3. B12 nutritional deficiency We will check labs  today.  Patient will continue with his diet and exercise, and we will follow-up at his next visit.  - Vitamin B12  4. Prediabetes We will check labs today.  Patient will continue with his diet and exercise, and we will follow-up at his next visit.  - CMP14+EGFR - Insulin, random - Hemoglobin A1c  5. Emotional Eating Behavior Patient will continue Wellbutrin SR 150 mg every morning, and we will refill for 1 month.  - buPROPion (WELLBUTRIN SR) 150 MG 12 hr tablet; Take 1 tablet (150 mg total) by mouth every morning.  Dispense: 30 tablet; Refill: 0  6. BMI 40.0-44.9, adult (HCC)  7. Obesity, Beginning BMI 46.48 Brian Steele is currently in the action stage of change. As such, his goal is to continue with weight loss efforts. He has agreed to the Category 3 Plan.   Exercise goals: As is.   Behavioral modification strategies: increasing lean protein intake.  Brian Steele has agreed to follow-up with our clinic in 4 weeks. He was informed of the importance of frequent follow-up visits to maximize his success with intensive lifestyle modifications for his multiple health conditions.   Brian Steele was informed we would discuss his lab results at his next visit unless there is a critical issue that needs to be addressed sooner. Brian Steele agreed to keep his next visit at the agreed upon time to discuss these results.  Objective:   Blood pressure (!) 145/80, pulse 69, height 5\' 6"  (1.676 m), weight 274 lb (124.3 kg), SpO2  92%. Body mass index is 44.22 kg/m.  Lab Results  Component Value Date   CREATININE 1.03 04/16/2023   BUN 12 04/16/2023   NA 142 04/16/2023   K 4.2 04/16/2023   CL 104 04/16/2023   CO2 21 04/16/2023   Lab Results  Component Value Date   ALT 22 04/16/2023   AST 24 04/16/2023   ALKPHOS 109 04/16/2023   BILITOT 0.8 04/16/2023   Lab Results  Component Value Date   HGBA1C 5.5 02/13/2023   HGBA1C 6.0 (H) 11/13/2022   HGBA1C 5.5 07/24/2021   HGBA1C 6.1 01/18/2021   Lab Results   Component Value Date   INSULIN 12.5 04/16/2023   Lab Results  Component Value Date   TSH 1.28 02/25/2023   Lab Results  Component Value Date   CHOL 132 02/25/2023   HDL 44 02/25/2023   LDLCALC 71 02/25/2023   TRIG 84 02/25/2023   CHOLHDL 3.0 11/13/2022   Lab Results  Component Value Date   VD25OH 33.38 02/25/2023   VD25OH 27.0 (L) 11/13/2022   VD25OH 34.1 01/18/2021   Lab Results  Component Value Date   WBC 13.2 02/25/2023   HGB 15.8 02/25/2023   HCT 48 02/25/2023   MCV 87 12/04/2022   PLT 279 02/25/2023   Lab Results  Component Value Date   IRON 54 02/25/2023   TIBC 405.00 02/25/2023   FERRITIN 69.30 02/25/2023   Attestation Statements:   Reviewed by clinician on day of visit: allergies, medications, problem list, medical history, surgical history, family history, social history, and previous encounter notes.   I, Burt Knack, am acting as transcriptionist for Quillian Quince, MD.  I have reviewed the above documentation for accuracy and completeness, and I agree with the above. -  Quillian Quince, MD

## 2023-08-21 LAB — HEMOGLOBIN A1C
Est. average glucose Bld gHb Est-mCnc: 120 mg/dL
Hgb A1c MFr Bld: 5.8 % — ABNORMAL HIGH (ref 4.8–5.6)

## 2023-08-21 LAB — CMP14+EGFR
ALT: 16 [IU]/L (ref 0–44)
AST: 19 [IU]/L (ref 0–40)
Albumin: 4.3 g/dL (ref 3.9–4.9)
Alkaline Phosphatase: 120 [IU]/L (ref 44–121)
BUN/Creatinine Ratio: 15 (ref 10–24)
BUN: 16 mg/dL (ref 8–27)
Bilirubin Total: 0.8 mg/dL (ref 0.0–1.2)
CO2: 21 mmol/L (ref 20–29)
Calcium: 8.9 mg/dL (ref 8.6–10.2)
Chloride: 105 mmol/L (ref 96–106)
Creatinine, Ser: 1.09 mg/dL (ref 0.76–1.27)
Globulin, Total: 3 g/dL (ref 1.5–4.5)
Glucose: 90 mg/dL (ref 70–99)
Potassium: 4.5 mmol/L (ref 3.5–5.2)
Sodium: 141 mmol/L (ref 134–144)
Total Protein: 7.3 g/dL (ref 6.0–8.5)
eGFR: 77 mL/min/{1.73_m2} (ref >59)

## 2023-08-21 LAB — LIPID PANEL WITH LDL/HDL RATIO
Cholesterol, Total: 150 mg/dL (ref 100–199)
HDL: 43 mg/dL (ref >39)
LDL Chol Calc (NIH): 88 mg/dL (ref 0–99)
LDL/HDL Ratio: 2 {ratio} (ref 0.0–3.6)
Triglycerides: 103 mg/dL (ref 0–149)
VLDL Cholesterol Cal: 19 mg/dL (ref 5–40)

## 2023-08-21 LAB — VITAMIN B12: Vitamin B-12: 525 pg/mL (ref 232–1245)

## 2023-08-21 LAB — VITAMIN D 25 HYDROXY (VIT D DEFICIENCY, FRACTURES): Vit D, 25-Hydroxy: 59.7 ng/mL (ref 30.0–100.0)

## 2023-08-21 LAB — INSULIN, RANDOM: INSULIN: 29.1 u[IU]/mL — ABNORMAL HIGH (ref 2.6–24.9)

## 2023-08-26 ENCOUNTER — Ambulatory Visit: Payer: Medicare HMO | Admitting: Family Medicine

## 2023-08-26 ENCOUNTER — Encounter: Payer: Self-pay | Admitting: Family Medicine

## 2023-08-26 VITALS — BP 127/88 | HR 65 | Temp 98.3°F | Ht 66.0 in | Wt 278.0 lb

## 2023-08-26 DIAGNOSIS — J441 Chronic obstructive pulmonary disease with (acute) exacerbation: Secondary | ICD-10-CM | POA: Diagnosis not present

## 2023-08-26 LAB — RAPID STREP SCREEN (MED CTR MEBANE ONLY): Strep Gp A Ag, IA W/Reflex: NEGATIVE

## 2023-08-26 LAB — CULTURE, GROUP A STREP

## 2023-08-26 MED ORDER — DOXYCYCLINE HYCLATE 100 MG PO TABS
100.0000 mg | ORAL_TABLET | Freq: Two times a day (BID) | ORAL | 0 refills | Status: AC
Start: 2023-08-26 — End: 2023-09-02

## 2023-08-26 MED ORDER — PREDNISONE 20 MG PO TABS
40.0000 mg | ORAL_TABLET | Freq: Every day | ORAL | 0 refills | Status: AC
Start: 2023-08-26 — End: 2023-08-29

## 2023-08-26 MED ORDER — BENZONATATE 100 MG PO CAPS: 100.0000 mg | ORAL_CAPSULE | Freq: Three times a day (TID) | ORAL | 0 refills | Status: DC | PRN

## 2023-08-26 MED ORDER — METHYLPREDNISOLONE ACETATE 40 MG/ML IJ SUSP
40.0000 mg | Freq: Once | INTRAMUSCULAR | Status: AC
Start: 2023-08-26 — End: 2023-08-26
  Administered 2023-08-26: 40 mg via INTRAMUSCULAR

## 2023-08-26 MED ORDER — IPRATROPIUM-ALBUTEROL 0.5-2.5 (3) MG/3ML IN SOLN
3.0000 mL | Freq: Once | RESPIRATORY_TRACT | Status: AC
Start: 2023-08-26 — End: 2023-08-26
  Administered 2023-08-26: 3 mL via RESPIRATORY_TRACT

## 2023-08-26 MED ORDER — ALBUTEROL SULFATE HFA 108 (90 BASE) MCG/ACT IN AERS
2.0000 | INHALATION_SPRAY | Freq: Four times a day (QID) | RESPIRATORY_TRACT | 0 refills | Status: DC | PRN
Start: 2023-08-26 — End: 2023-10-15

## 2023-08-26 NOTE — Progress Notes (Signed)
Subjective: CC: URI PCP: Sonny Masters, FNP ZOX:[id number] Brian Steele is a 62 y.o. male presenting to clinic today for:  1.  URI Patient is accompanied to the office by his wife.  He has had about a 2-day history of sore throat, productive cough and rib pain when coughing.  He denies any measured fevers.  No known sick contacts.  No nausea, vomiting.  He has had some increased shortness of breath and wheezing but no hemoptysis.  He is using Alka-Seltzer plus with some help.  He needs a new rescue inhaler.  Has Breztri at baseline.  Has been hospitalized for COPD exacerbation in the past.  No home testing for COVID   ROS: Per HPI  Allergies  Allergen Reactions   Bee Venom Swelling   Other Hives    In laundry detergents   In laundry detergents   Sulfa Antibiotics Swelling   Sodium Hypochlorite Rash   Past Medical History:  Diagnosis Date   Allergy    Aortic atherosclerosis (HCC) 01/12/2022   Arthritis    Back pain    Barrett's esophagus    Bilateral swelling of feet    Chest pain    COPD (chronic obstructive pulmonary disease) (HCC)    Gallbladder problem    Hyperlipidemia    Obstructive sleep apnea    Prediabetes    SOB (shortness of breath)    Vitamin D deficiency     Current Outpatient Medications:    albuterol (VENTOLIN HFA) 108 (90 Base) MCG/ACT inhaler, Inhale 2 puffs into the lungs every 6 (six) hours as needed for wheezing or shortness of breath., Disp: 8 g, Rfl: 0   aspirin 81 MG EC tablet, Take 1 tablet by mouth daily., Disp: , Rfl:    benzonatate (TESSALON PERLES) 100 MG capsule, Take 1 capsule (100 mg total) by mouth 3 (three) times daily as needed for cough., Disp: 20 capsule, Rfl: 0   Budeson-Glycopyrrol-Formoterol (BREZTRI AEROSPHERE) 160-9-4.8 MCG/ACT AERO, Inhale 2 puffs into the lungs 2 (two) times daily., Disp: 10.7 g, Rfl: 11   buPROPion (WELLBUTRIN SR) 150 MG 12 hr tablet, Take 1 tablet (150 mg total) by mouth every morning., Disp: 30 tablet, Rfl: 0    doxycycline (VIBRA-TABS) 100 MG tablet, Take 1 tablet (100 mg total) by mouth 2 (two) times daily for 7 days., Disp: 14 tablet, Rfl: 0   HYDROcodone-acetaminophen (NORCO/VICODIN) 5-325 MG tablet, Take 1 tablet by mouth 2 (two) times daily., Disp: , Rfl:    naloxone (NARCAN) nasal spray 4 mg/0.1 mL, Place into the nose., Disp: , Rfl:    predniSONE (DELTASONE) 20 MG tablet, Take 2 tablets (40 mg total) by mouth daily with breakfast for 3 days., Disp: 6 tablet, Rfl: 0   simvastatin (ZOCOR) 40 MG tablet, TAKE 1 TABLET BY MOUTH EVERY DAY, Disp: 90 tablet, Rfl: 1   Vitamin D, Ergocalciferol, (DRISDOL) 1.25 MG (50000 UNIT) CAPS capsule, Take 1 capsule (50,000 Units total) by mouth every 7 (seven) days., Disp: 4 capsule, Rfl: 0  Current Facility-Administered Medications:    methylPREDNISolone acetate (DEPO-MEDROL) injection 40 mg, 40 mg, Intramuscular, Once,  Social History   Socioeconomic History   Marital status: Married    Spouse name: Not on file   Number of children: Not on file   Years of education: Not on file   Highest education level: Not on file  Occupational History   Occupation: Retired  Tobacco Use   Smoking status: Former    Current packs/day: 0.00  Average packs/day: 2.0 packs/day for 26.0 years (52.0 ttl pk-yrs)    Types: Cigarettes    Start date: 78    Quit date: 2018    Years since quitting: 6.7   Smokeless tobacco: Never  Vaping Use   Vaping status: Never Used  Substance and Sexual Activity   Alcohol use: Not Currently   Drug use: Never   Sexual activity: Yes  Other Topics Concern   Not on file  Social History Narrative   ** Merged History Encounter **       Social Determinants of Health   Financial Resource Strain: Low Risk  (02/19/2023)   Overall Financial Resource Strain (CARDIA)    Difficulty of Paying Living Expenses: Not hard at all  Food Insecurity: No Food Insecurity (02/19/2023)   Hunger Vital Sign    Worried About Running Out of Food in the Last  Year: Never true    Ran Out of Food in the Last Year: Never true  Transportation Needs: No Transportation Needs (02/19/2023)   PRAPARE - Administrator, Civil Service (Medical): No    Lack of Transportation (Non-Medical): No  Physical Activity: Inactive (02/19/2023)   Exercise Vital Sign    Days of Exercise per Week: 0 days    Minutes of Exercise per Session: 0 min  Stress: No Stress Concern Present (02/19/2023)   [employer] of Occupational Health - Occupational Stress Questionnaire    Feeling of Stress : Not at all  Social Connections: Moderately Isolated (02/19/2023)   Social Connection and Isolation Panel [NHANES]    Frequency of Communication with Friends and Family: More than three times a week    Frequency of Social Gatherings with Friends and Family: Three times a week    Attends Religious Services: Never    Active Member of Clubs or Organizations: No    Attends Banker Meetings: Never    Marital Status: Married  Catering manager Violence: Not At Risk (02/19/2023)   Humiliation, Afraid, Rape, and Kick questionnaire    Fear of Current or Ex-Partner: No    Emotionally Abused: No    Physically Abused: No    Sexually Abused: No   Family History  Problem Relation Age of Onset   Arthritis/Rheumatoid Mother    Cancer Father    Diabetes Father    Kidney cancer Father    Obesity Father    Hypertension Brother    Hyperlipidemia Brother    Obesity Daughter     Objective: Office vital signs reviewed. BP 127/88   Pulse 65   Temp 98.3 F (36.8 C)   Ht 5\' 6"  (1.676 m)   Wt 278 lb (126.1 kg)   SpO2 92%   BMI 44.87 kg/m   Physical Examination:  General: Awake, alert, morbidly obese, No acute distress HEENT: Normal    Neck: No masses palpated. No lymphadenopathy    Ears: Tympanic membranes intact, normal light reflex, no erythema, no bulging    Eyes: PERRLA, extraocular membranes intact, sclera white    Nose: nasal turbinates moist, clear nasal  discharge.  Swelling and erythema of the left nasal turbinates    Throat: moist mucus membranes, minimal oropharyngeal erythema, no tonsillar exudate.  Airway is patent Cardio: regular rate and rhythm, S1S2 heard, no murmurs appreciated Pulm: Globally decreased sounds that are coarse.  He has normal work of breathing on room air.  Coughing appreciated.    Assessment/ Plan: 62 y.o. male   COPD exacerbation (HCC) - Plan: Rapid  Strep Screen ([facility] ONLY), COVID-19, Flu A+B and RSV, methylPREDNISolone acetate (DEPO-MEDROL) injection 40 mg, predniSONE (DELTASONE) 20 MG tablet, doxycycline (VIBRA-TABS) 100 MG tablet, albuterol (VENTOLIN HFA) 108 (90 Base) MCG/ACT inhaler, benzonatate (TESSALON PERLES) 100 MG capsule  I am going to test for viral mediated etiology but given increased sputum production, coughing and known COPD will treat as exacerbation.  Upon initial auscultation, he had decreased breath sounds but no overt wheezes appreciated.  Breath sounds were somewhat coarse.  After administration of DuoNeb he had much better air movement and absence of wheezes or coarse breath sounds.  I have sent him over prednisone burst to start tomorrow with breakfast, doxycycline to take twice daily, cough Perles if needed and a rescue inhaler which he may use every 6 hours if needed.  We discussed red flag signs and symptoms warranting further evaluation and he voiced good understanding of follow-up as needed   Aldona Bryner Hulen Skains, DO [facility] [phone]

## 2023-08-26 NOTE — Patient Instructions (Addendum)
Start Prednisone burst tomorrow with breakfast Start doxycycline with supper tonight.  Make sure to eat with it so you don't get nauseated Ok to use your albuterol in 6 hours if needed. Cough suppressant sent in as well. Viral tests should be back in a couple of days. Follow up in office IF: fevers that won't go down, coughing brown or bloody phlegm, worsening shortness of breath

## 2023-08-26 NOTE — Addendum Note (Signed)
Addended by: Waynette Buttery on: 08/26/2023 03:44 PM   Modules accepted: Orders

## 2023-08-27 LAB — COVID-19, FLU A+B AND RSV
Influenza A, NAA: NOT DETECTED
Influenza B, NAA: NOT DETECTED
RSV, NAA: NOT DETECTED
SARS-CoV-2, NAA: NOT DETECTED

## 2023-09-10 ENCOUNTER — Encounter (INDEPENDENT_AMBULATORY_CARE_PROVIDER_SITE_OTHER): Payer: Self-pay | Admitting: Family Medicine

## 2023-09-10 ENCOUNTER — Ambulatory Visit (INDEPENDENT_AMBULATORY_CARE_PROVIDER_SITE_OTHER): Payer: Medicare HMO | Admitting: Family Medicine

## 2023-09-10 VITALS — BP 136/62 | HR 67 | Temp 97.7°F | Ht 66.0 in | Wt 273.0 lb

## 2023-09-10 DIAGNOSIS — E669 Obesity, unspecified: Secondary | ICD-10-CM

## 2023-09-10 DIAGNOSIS — R7303 Prediabetes: Secondary | ICD-10-CM | POA: Diagnosis not present

## 2023-09-10 DIAGNOSIS — E559 Vitamin D deficiency, unspecified: Secondary | ICD-10-CM | POA: Diagnosis not present

## 2023-09-10 DIAGNOSIS — E785 Hyperlipidemia, unspecified: Secondary | ICD-10-CM | POA: Diagnosis not present

## 2023-09-10 DIAGNOSIS — Z6841 Body Mass Index (BMI) 40.0 and over, adult: Secondary | ICD-10-CM

## 2023-09-10 DIAGNOSIS — E782 Mixed hyperlipidemia: Secondary | ICD-10-CM

## 2023-09-10 MED ORDER — METFORMIN HCL 500 MG PO TABS: 500.0000 mg | ORAL_TABLET | Freq: Every day | ORAL | 0 refills | Status: DC

## 2023-09-10 NOTE — Progress Notes (Signed)
.[facility]  Office: [phone]  /  Fax: [phone]  WEIGHT SUMMARY AND BIOMETRICS  Anthropometric Measurements Height: 5\' 6"  (1.676 m) Weight: 273 lb (123.8 kg) BMI (Calculated): 44.08 Weight at Last Visit: 274 lb Weight Lost Since Last Visit: 1 Weight Gained Since Last Visit: 0 Starting Weight: 288 lb Total Weight Loss (lbs): 15 lb (6.804 kg)   Body Composition  Body Fat %: 39.4 % Fat Mass (lbs): 107.6 lbs Muscle Mass (lbs): 157.4 lbs Total Body Water (lbs): 116.6 lbs Visceral Fat Rating : 27   Other Clinical Data Fasting: no Labs: no Today's Visit #: 9 Starting Date: 04/16/23    Chief Complaint: OBESITY   Discussed the use of AI scribe software for clinical note transcription with the patient, who gave verbal consent to proceed.  History of Present Illness   The patient, with a history of obesity and hyperlipidemia, presents for a follow-up visit. He reports a weight loss of one pound over the past three weeks, attributing this to adherence to a category three eating plan approximately 80% of the time. He also engages in walking and yard work for at least an hour, three times per week.  The patient has been managing his hyperlipidemia with Zocor 40mg  and dietary modifications. He reports no significant changes in his hunger levels, despite recent stressors and disruptions to his routine. He has been consuming protein shakes for additional nutritional support, specifically a brand believed to support immune health.  The patient also has a history of elevated blood pressure, which was well-controlled at the time of the visit. Recent labs showed good kidney function, electrolyte balance, and liver function. His LDL cholesterol was slightly above the ideal range at 88, but his triglycerides were within normal limits at 103. His vitamin D levels improved from 33 to 59, and his B12 levels were adequate at 525. However, his hemoglobin A1c increased from 5.5 to 5.8, and his insulin  levels also rose, indicating a need for careful dietary management, particularly with carbohydrate intake.  He also reported some difficulty in obtaining his hydrocodone medication due to shortages.          PHYSICAL EXAM:  Blood pressure 136/62, pulse 67, temperature 97.7 F (36.5 C), height 5\' 6"  (1.676 m), weight 273 lb (123.8 kg), SpO2 96%. Body mass index is 44.06 kg/m.  DIAGNOSTIC DATA REVIEWED:  BMET    Component Value Date/Time   NA 141 08/20/2023 1041   K 4.5 08/20/2023 1041   CL 105 08/20/2023 1041   CO2 21 08/20/2023 1041   GLUCOSE 90 08/20/2023 1041   GLUCOSE 116 (H) 03/30/2009 1255   BUN 16 08/20/2023 1041   CREATININE 1.09 08/20/2023 1041   CALCIUM 8.9 08/20/2023 1041   GFRNONAA 80 06/22/2020 1219   GFRAA 93 06/22/2020 1219   Lab Results  Component Value Date   HGBA1C 5.8 (H) 08/20/2023   HGBA1C 6.1 01/18/2021   Lab Results  Component Value Date   INSULIN 29.1 (H) 08/20/2023   INSULIN 12.5 04/16/2023   Lab Results  Component Value Date   TSH 1.28 02/25/2023   CBC    Component Value Date/Time   WBC 13.2 02/25/2023 0000   WBC 9.8 12/11/2009 1446   RBC 5.46 (A) 02/24/2023 0000   HGB 15.8 02/25/2023 0000   HGB 14.9 12/04/2022 1211   HCT 48 02/25/2023 0000   HCT 43.9 12/04/2022 1211   PLT 279 02/25/2023 0000   PLT 277 12/04/2022 1211   MCV 87 12/04/2022 1211  MCH 29.4 12/04/2022 1211   MCHC 33.9 12/04/2022 1211   MCHC 34.5 12/11/2009 1446   RDW 13.6 12/04/2022 1211   Iron Studies    Component Value Date/Time   IRON 54 02/25/2023 0000   TIBC 405.00 02/25/2023 0000   FERRITIN 69.30 02/25/2023 0000   Lipid Panel     Component Value Date/Time   CHOL 150 08/20/2023 1041   TRIG 103 08/20/2023 1041   HDL 43 08/20/2023 1041   CHOLHDL 3.0 11/13/2022 1516   LDLCALC 88 08/20/2023 1041   Hepatic Function Panel     Component Value Date/Time   PROT 7.3 08/20/2023 1041   ALBUMIN 4.3 08/20/2023 1041   AST 19 08/20/2023 1041   ALT 16  08/20/2023 1041   ALKPHOS 120 08/20/2023 1041   BILITOT 0.8 08/20/2023 1041   BILIDIR 0.2 03/31/2009 0500   IBILI 1.1 (H) 03/31/2009 0500      Component Value Date/Time   TSH 1.28 02/25/2023 0000   TSH 1.250 11/13/2022 1516   Nutritional Lab Results  Component Value Date   VD25OH 59.7 08/20/2023   VD25OH 33.38 02/25/2023   VD25OH 27.0 (L) 11/13/2022     Assessment and Plan    Obesity Patient has lost 1 pound in the last three weeks and reports adhering to category three eating plan 80% of the time. Engaging in physical activity such as walking and yard work for at least an hour three times per week. -Continue category three eating plan and physical activity. -Start Metformin 500mg  daily with first meal to assist with weight loss.  Hyperlipidemia Patient is on Zocor 40mg  and working on diet and exercise to improve cholesterol levels. Recent labs show LDL at 88 and triglycerides at 103. -Continue Zocor 40mg  daily. -Continue diet and exercise regimen.  Vitamin D deficiency Recent labs show improvement with levels at 59. -Continue current Vitamin D supplementation.  Prediabetes Recent labs show a slight increase in hemoglobin A1c from 5.5 to 5.8 and an increase in insulin levels. -Continue diet and exercise regimen focusing on lean proteins and vegetables. -Start Metformin 500mg  daily with first meal.  Follow-up appointment scheduled for October 22, 2023.       He was informed of the importance of frequent follow up visits to maximize his success with intensive lifestyle modifications for his multiple health conditions.    Quillian Quince, MD

## 2023-09-11 ENCOUNTER — Ambulatory Visit (INDEPENDENT_AMBULATORY_CARE_PROVIDER_SITE_OTHER): Payer: Medicare HMO

## 2023-09-11 DIAGNOSIS — Z23 Encounter for immunization: Secondary | ICD-10-CM

## 2023-10-15 ENCOUNTER — Ambulatory Visit (INDEPENDENT_AMBULATORY_CARE_PROVIDER_SITE_OTHER): Payer: Medicare HMO

## 2023-10-15 ENCOUNTER — Other Ambulatory Visit (INDEPENDENT_AMBULATORY_CARE_PROVIDER_SITE_OTHER): Payer: Self-pay | Admitting: Family Medicine

## 2023-10-15 ENCOUNTER — Encounter: Payer: Self-pay | Admitting: Family Medicine

## 2023-10-15 ENCOUNTER — Ambulatory Visit (INDEPENDENT_AMBULATORY_CARE_PROVIDER_SITE_OTHER): Payer: Medicare HMO | Admitting: Family Medicine

## 2023-10-15 VITALS — BP 125/76 | HR 67 | Temp 98.2°F | Ht 66.0 in | Wt 276.0 lb

## 2023-10-15 DIAGNOSIS — R051 Acute cough: Secondary | ICD-10-CM | POA: Diagnosis not present

## 2023-10-15 DIAGNOSIS — J441 Chronic obstructive pulmonary disease with (acute) exacerbation: Secondary | ICD-10-CM

## 2023-10-15 DIAGNOSIS — J449 Chronic obstructive pulmonary disease, unspecified: Secondary | ICD-10-CM

## 2023-10-15 DIAGNOSIS — R062 Wheezing: Secondary | ICD-10-CM | POA: Diagnosis not present

## 2023-10-15 DIAGNOSIS — F3289 Other specified depressive episodes: Secondary | ICD-10-CM

## 2023-10-15 MED ORDER — ALBUTEROL SULFATE (2.5 MG/3ML) 0.083% IN NEBU
2.5000 mg | INHALATION_SOLUTION | Freq: Once | RESPIRATORY_TRACT | Status: AC
Start: 2023-10-15 — End: 2023-10-15
  Administered 2023-10-15: 2.5 mg via RESPIRATORY_TRACT

## 2023-10-15 MED ORDER — IPRATROPIUM BROMIDE 0.02 % IN SOLN
0.5000 mg | Freq: Once | RESPIRATORY_TRACT | Status: AC
Start: 2023-10-15 — End: 2023-10-15
  Administered 2023-10-15: 0.5 mg via RESPIRATORY_TRACT

## 2023-10-15 MED ORDER — ALBUTEROL SULFATE HFA 108 (90 BASE) MCG/ACT IN AERS
2.0000 | INHALATION_SPRAY | Freq: Four times a day (QID) | RESPIRATORY_TRACT | 0 refills | Status: DC | PRN
Start: 2023-10-15 — End: 2023-11-06

## 2023-10-15 MED ORDER — AMOXICILLIN-POT CLAVULANATE 875-125 MG PO TABS
1.0000 | ORAL_TABLET | Freq: Two times a day (BID) | ORAL | 0 refills | Status: AC
Start: 2023-10-15 — End: 2023-10-22

## 2023-10-15 NOTE — Progress Notes (Signed)
Negative CXR. Recommend patient continue current treatment plan. If not improving, follow up.

## 2023-10-15 NOTE — Progress Notes (Signed)
Subjective:  Patient ID: Brian Steele, male    DOB: 01-22-1961, 62 y.o.   MRN: [id number]  Patient Care Team: Sonny Masters, FNP as PCP - General (Family Medicine) Kizzie Bane as Physician Assistant (Physician Assistant) Verlin Dike., MD as Referring Physician (Sports Medicine)   Chief Complaint:  Cough  HPI: Brian Steele is a 62 y.o. male presenting on 10/15/2023 for Cough  States that he cannot get rid of cough. States that it has been going on for a few weeks and the last two weeks has increased cough and chest tightness. Produces clear phlegm.  Has tried Catering manager plus. States that he is not completely compliant with Breztri. Reports that the pharmacy did not have albuterol inhaler, so he has not been using that. Endorses sinus pressure, sore throat ear pain. Denies fever. Endorses trouble breathing. Wife states that he is making "weird sounds" while sleeping. Was not able to complete allergy testing due to difficulty breathing. Completed prednisone burst and doxycycline. Concerned because their daughter has pneumonia.   Relevant past medical, surgical, family, and social history reviewed and updated as indicated.  Allergies and medications reviewed and updated. Data reviewed: Chart in Epic.   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   SHOULDER SURGERY Bilateral     Social History    Socioeconomic History   Marital status: Married    Spouse name: Not on file   Number of children: Not on file   Years of education: Not on file   Highest education level: Not on file  Occupational History   Occupation: Retired  Tobacco Use   Smoking status: Former    Current packs/day: 0.00    Average packs/day: 2.0 packs/day for 26.0 years (52.0 ttl pk-yrs)    Types: Cigarettes    Start date: 29    Quit date: 2018    Years since quitting: 6.9   Smokeless tobacco: Never  Vaping Use   Vaping status: Never Used  Substance and Sexual Activity   Alcohol use: Not Currently   Drug use: Never   Sexual activity: Yes  Other Topics Concern   Not on file  Social History Narrative   ** Merged History Encounter **       Social Determinants of Health   Financial Resource Strain: Patient Declined (10/15/2023)   Overall Financial Resource Strain (CARDIA)    Difficulty of Paying Living Expenses: Patient declined  Food Insecurity: Patient Declined (10/15/2023)   Hunger Vital Sign    Worried About Running Out of Food in the Last Year: Patient declined    Ran Out of Food in the Last Year: Patient declined  Transportation Needs: Patient Declined (10/15/2023)   PRAPARE - Transportation  Lack of Transportation (Medical): Patient declined    Lack of Transportation (Non-Medical): Patient declined  Physical Activity: Unknown (10/15/2023)   Exercise Vital Sign    Days of Exercise per Week: Patient declined    Minutes of Exercise per Session: 0 min  Stress: Patient Declined (10/15/2023)   [employer] of Occupational Health - Occupational Stress Questionnaire    Feeling of Stress : Patient declined  Social Connections: Unknown (10/15/2023)   Social Connection and Isolation Panel [NHANES]    Frequency of Communication with Friends and Family: Patient declined    Frequency of Social Gatherings with Friends and Family: Patient declined    Attends Religious Services: Patient declined     Database administrator or Organizations: Patient declined    Attends Banker Meetings: Never    Marital Status: Patient declined  Intimate Partner Violence: Not At Risk (02/19/2023)   Humiliation, Afraid, Rape, and Kick questionnaire    Fear of Current or Ex-Partner: No    Emotionally Abused: No    Physically Abused: No    Sexually Abused: No    Outpatient Encounter Medications as of 10/15/2023  Medication Sig   albuterol (VENTOLIN HFA) 108 (90 Base) MCG/ACT inhaler Inhale 2 puffs into the lungs every 6 (six) hours as needed for wheezing or shortness of breath.   aspirin 81 MG EC tablet Take 1 tablet by mouth daily.   benzonatate (TESSALON PERLES) 100 MG capsule Take 1 capsule (100 mg total) by mouth 3 (three) times daily as needed for cough.   Budeson-Glycopyrrol-Formoterol (BREZTRI AEROSPHERE) 160-9-4.8 MCG/ACT AERO Inhale 2 puffs into the lungs 2 (two) times daily.   buPROPion (WELLBUTRIN SR) 150 MG 12 hr tablet Take 1 tablet (150 mg total) by mouth every morning.   HYDROcodone-acetaminophen (NORCO/VICODIN) 5-325 MG tablet Take 1 tablet by mouth 2 (two) times daily.   metFORMIN (GLUCOPHAGE) 500 MG tablet Take 1 tablet (500 mg total) by mouth daily with breakfast.   naloxone (NARCAN) nasal spray 4 mg/0.1 mL Place into the nose.   simvastatin (ZOCOR) 40 MG tablet TAKE 1 TABLET BY MOUTH EVERY DAY   Vitamin D, Ergocalciferol, (DRISDOL) 1.25 MG (50000 UNIT) CAPS capsule Take 1 capsule (50,000 Units total) by mouth every 7 (seven) days.   No facility-administered encounter medications on file as of 10/15/2023.    Allergies  Allergen Reactions   Bee Venom Swelling   Other Hives    In laundry detergents   In laundry detergents   Sulfa Antibiotics Swelling   Sodium Hypochlorite Rash    Review of Systems As per HPI  Objective:  BP 125/76   Pulse 67   Temp 98.2 F (36.8 C)   Ht 5\' 6"  (1.676 m)   Wt 276 lb (125.2 kg)   SpO2 96%   BMI 44.55 kg/m    Wt Readings from  Last 3 Encounters:  10/15/23 276 lb (125.2 kg)  09/10/23 273 lb (123.8 kg)  08/26/23 278 lb (126.1 kg)   Physical Exam Constitutional:      General: He is awake. He is not in acute distress.    Appearance: Normal appearance. He is well-developed and well-groomed. He is not ill-appearing, toxic-appearing or diaphoretic.  HENT:     Right Ear: No laceration, drainage, swelling or tenderness. A middle ear effusion is present. There is no impacted cerumen. No foreign body. No mastoid tenderness. No PE tube. No hemotympanum. Tympanic membrane is injected and erythematous. Tympanic membrane is not scarred, perforated, retracted or bulging.  Left Ear: No laceration, drainage, swelling or tenderness. A middle ear effusion is present. There is no impacted cerumen. No foreign body. No mastoid tenderness. No PE tube. No hemotympanum. Tympanic membrane is injected and erythematous. Tympanic membrane is not scarred, perforated, retracted or bulging.     Nose: Congestion and rhinorrhea present. Rhinorrhea is clear.     Right Sinus: No maxillary sinus tenderness or frontal sinus tenderness.     Left Sinus: No maxillary sinus tenderness or frontal sinus tenderness.     Mouth/Throat:     Lips: Pink. No lesions.     Mouth: Mucous membranes are moist.     Tongue: No lesions.     Palate: No mass and lesions.     Pharynx: Oropharynx is clear. No pharyngeal swelling, oropharyngeal exudate, posterior oropharyngeal erythema or postnasal drip.     Tonsils: No tonsillar exudate or tonsillar abscesses. 2+ on the right. 2+ on the left.  Cardiovascular:     Rate and Rhythm: Normal rate and regular rhythm.     Pulses: Normal pulses.          Radial pulses are 2+ on the right side and 2+ on the left side.       Posterior tibial pulses are 2+ on the right side and 2+ on the left side.     Heart sounds: Normal heart sounds. No murmur heard.    No gallop.  Pulmonary:     Effort: Pulmonary effort is normal. No  respiratory distress.     Breath sounds: Decreased air movement present. No stridor or transmitted upper airway sounds. Examination of the right-upper field reveals decreased breath sounds. Examination of the left-upper field reveals decreased breath sounds. Examination of the right-middle field reveals decreased breath sounds and wheezing. Examination of the left-middle field reveals decreased breath sounds. Examination of the right-lower field reveals decreased breath sounds and wheezing. Examination of the left-lower field reveals decreased breath sounds. Decreased breath sounds and wheezing present. No rhonchi or rales.     Comments: Breath sounds slightly increased after duoneb. Continued to have wheeze in base of right side.  Musculoskeletal:     Cervical back: Full passive range of motion without pain and neck supple.     Right lower leg: No edema.     Left lower leg: No edema.  Lymphadenopathy:     Head:     Right side of head: No submental, submandibular, tonsillar, preauricular or posterior auricular adenopathy.     Left side of head: No submental, submandibular, tonsillar, preauricular or posterior auricular adenopathy.     Comments: Tender to palpation along cervical chains and tonsillar nodes, no palpable lymph node enlargement.   Skin:    General: Skin is warm.     Capillary Refill: Capillary refill takes less than 2 seconds.  Neurological:     General: No focal deficit present.     Mental Status: He is alert, oriented to person, place, and time and easily aroused. Mental status is at baseline.     GCS: GCS eye subscore is 4. GCS verbal subscore is 5. GCS motor subscore is 6.     Motor: No weakness.  Psychiatric:        Attention and Perception: Attention and perception normal.        Mood and Affect: Mood and affect normal.        Speech: Speech normal.        Behavior: Behavior normal. Behavior is cooperative.  Thought Content: Thought content normal. Thought content  does not include homicidal or suicidal ideation. Thought content does not include homicidal or suicidal plan.        Cognition and Memory: Cognition and memory normal.        Judgment: Judgment normal.    Results for orders placed or performed in visit on 08/26/23  Rapid Strep Screen ([facility] ONLY)   Specimen: Other   Other  Result Value Ref Range   Strep Gp A Ag, IA W/Reflex Negative Negative  COVID-19, Flu A+B and RSV   Specimen: Nasopharyngeal(NP) swabs in vial transport medium  Result Value Ref Range   SARS-CoV-2, NAA Not Detected Not Detected   Influenza A, NAA Not Detected Not Detected   Influenza B, NAA Not Detected Not Detected   RSV, NAA Not Detected Not Detected   Test Information: Comment   Culture, Group A Strep   Other  Result Value Ref Range   Strep A Culture CANCELED        10/15/2023   11:08 AM 08/26/2023    2:49 PM 03/20/2023    9:36 AM 02/19/2023    9:53 AM 02/13/2023   11:04 AM  Depression screen PHQ 2/9  Decreased Interest 0 0 0 0 0  Down, Depressed, Hopeless 0 0 0 0 0  PHQ - 2 Score 0 0 0 0 0  Altered sleeping 0 0 0 0 0  Tired, decreased energy 0 0 1 0 0  Change in appetite 0 0 0 0 0  Feeling bad or failure about yourself  0 0 0 0 0  Trouble concentrating 0 0 0 0 0  Moving slowly or fidgety/restless 0 0 0 0 0  Suicidal thoughts 0 0 0 0 0  PHQ-9 Score 0 0 1 0 0  Difficult doing work/chores Not difficult at all Not difficult at all Not difficult at all Not difficult at all Not difficult at all       10/15/2023   11:08 AM 08/26/2023    2:49 PM 03/20/2023    9:36 AM 02/13/2023   11:04 AM  GAD 7 : Generalized Anxiety Score  Nervous, Anxious, on Edge 0 0 0 0  Control/stop worrying 0 0 0 0  Worry too much - different things 0 0 0 0  Trouble relaxing 0 0 0 0  Restless 0 0 0 0  Easily annoyed or irritable 0 0 0 0  Afraid - awful might happen 0 0 0 0  Total GAD 7 Score 0 0 0 0  Anxiety Difficulty Not difficult at all Not difficult at all Not  difficult at all Not difficult at all   Pertinent labs & imaging results that were available during my care of the patient were reviewed by me and considered in my medical decision making.  Assessment & Plan:  Diagnoses and all orders for this visit:  Wheezing Imaging as below. Will communicate results to patient once available. Will await results to determine next steps. Given history of COPD, will cover for pneumonia with antibiotic as below as patient has already completed doxycycline. Will refill albuterol as patient did not receive initial prescription. Reiterated to patient the importance of compliance with Breztri. Encouraged patient to follow up with PCP. Discussed return precautions.  -     DG Chest 2 View; Future -     amoxicillin-clavulanate (AUGMENTIN) 875-125 MG tablet; Take 1 tablet by mouth 2 (two) times daily for 7 days. -  albuterol (PROVENTIL) (2.5 MG/3ML) 0.083% nebulizer solution 2.5 mg -     ipratropium (ATROVENT) nebulizer solution 0.5 mg  Acute cough As above.  -     DG Chest 2 View; Future -     amoxicillin-clavulanate (AUGMENTIN) 875-125 MG tablet; Take 1 tablet by mouth 2 (two) times daily for 7 days. -     albuterol (PROVENTIL) (2.5 MG/3ML) 0.083% nebulizer solution 2.5 mg -     ipratropium (ATROVENT) nebulizer solution 0.5 mg  COPD mixed type (HCC) As above.  -     DG Chest 2 View; Future -     albuterol (PROVENTIL) (2.5 MG/3ML) 0.083% nebulizer solution 2.5 mg -     ipratropium (ATROVENT) nebulizer solution 0.5 mg  COPD exacerbation (HCC) As above.  -     DG Chest 2 View; Future -     albuterol (PROVENTIL) (2.5 MG/3ML) 0.083% nebulizer solution 2.5 mg -     ipratropium (ATROVENT) nebulizer solution 0.5 mg -     albuterol (VENTOLIN HFA) 108 (90 Base) MCG/ACT inhaler; Inhale 2 puffs into the lungs every 6 (six) hours as needed for wheezing or shortness of breath.  Continue all other maintenance medications.  Follow up plan: Return if symptoms worsen  or fail to improve.   Continue healthy lifestyle choices, including diet (rich in fruits, vegetables, and lean proteins, and low in salt and simple carbohydrates) and exercise (at least 30 minutes of moderate physical activity daily).  Written and verbal instructions provided   The above assessment and management plan was discussed with the patient. The patient verbalized understanding of and has agreed to the management plan. Patient is aware to call the clinic if they develop any new symptoms or if symptoms persist or worsen. Patient is aware when to return to the clinic for a follow-up visit. Patient educated on when it is appropriate to go to the emergency department.   Neale Burly, DNP-FNP [facility] [address] [place], [place] [address] [phone]

## 2023-10-22 ENCOUNTER — Ambulatory Visit (INDEPENDENT_AMBULATORY_CARE_PROVIDER_SITE_OTHER): Payer: Medicare HMO | Admitting: Family Medicine

## 2023-10-23 ENCOUNTER — Other Ambulatory Visit (INDEPENDENT_AMBULATORY_CARE_PROVIDER_SITE_OTHER): Payer: Self-pay | Admitting: Family Medicine

## 2023-10-23 DIAGNOSIS — R7303 Prediabetes: Secondary | ICD-10-CM

## 2023-10-31 ENCOUNTER — Encounter: Payer: Self-pay | Admitting: Family Medicine

## 2023-10-31 ENCOUNTER — Ambulatory Visit (INDEPENDENT_AMBULATORY_CARE_PROVIDER_SITE_OTHER): Payer: Medicare HMO | Admitting: Family Medicine

## 2023-10-31 DIAGNOSIS — I700 Atherosclerosis of aorta: Secondary | ICD-10-CM | POA: Diagnosis not present

## 2023-10-31 DIAGNOSIS — J449 Chronic obstructive pulmonary disease, unspecified: Secondary | ICD-10-CM

## 2023-10-31 DIAGNOSIS — I10 Essential (primary) hypertension: Secondary | ICD-10-CM

## 2023-10-31 DIAGNOSIS — R7303 Prediabetes: Secondary | ICD-10-CM | POA: Diagnosis not present

## 2023-10-31 DIAGNOSIS — E782 Mixed hyperlipidemia: Secondary | ICD-10-CM

## 2023-10-31 DIAGNOSIS — J301 Allergic rhinitis due to pollen: Secondary | ICD-10-CM | POA: Insufficient documentation

## 2023-10-31 DIAGNOSIS — E538 Deficiency of other specified B group vitamins: Secondary | ICD-10-CM | POA: Diagnosis not present

## 2023-10-31 DIAGNOSIS — E559 Vitamin D deficiency, unspecified: Secondary | ICD-10-CM

## 2023-10-31 LAB — LIPID PANEL

## 2023-10-31 LAB — BAYER DCA HB A1C WAIVED: HB A1C (BAYER DCA - WAIVED): 5.3 % (ref 4.8–5.6)

## 2023-10-31 MED ORDER — SIMVASTATIN 40 MG PO TABS
40.0000 mg | ORAL_TABLET | Freq: Every day | ORAL | 1 refills | Status: DC
Start: 2023-10-31 — End: 2024-05-19

## 2023-10-31 MED ORDER — FLUTICASONE PROPIONATE 50 MCG/ACT NA SUSP
2.0000 | Freq: Every day | NASAL | 6 refills | Status: AC
Start: 2023-10-31 — End: ?

## 2023-10-31 MED ORDER — OMEPRAZOLE 20 MG PO CPDR
20.0000 mg | DELAYED_RELEASE_CAPSULE | Freq: Every day | ORAL | 3 refills | Status: DC
Start: 2023-10-31 — End: 2023-10-31

## 2023-10-31 MED ORDER — LEVOCETIRIZINE DIHYDROCHLORIDE 5 MG PO TABS
5.0000 mg | ORAL_TABLET | Freq: Every evening | ORAL | 1 refills | Status: DC
Start: 2023-10-31 — End: 2024-01-26

## 2023-10-31 NOTE — Progress Notes (Signed)
Subjective:  Patient ID: Brian Steele, male    DOB: 23-Dec-1960, 62 y.o.   MRN: [id number]  Patient Care Team: Sonny Masters, FNP as PCP - General (Family Medicine) Kizzie Bane as Physician Assistant (Physician Assistant) Verlin Dike., MD as Referring Physician (Sports Medicine)   Chief Complaint:  Medical Management of Chronic Issues (6 month chronic follow up)   HPI: Brian Steele is a 62 y.o. male presenting on 10/31/2023 for Medical Management of Chronic Issues (6 month chronic follow up)   Discussed the use of AI scribe software for clinical note transcription with the patient, who gave verbal consent to proceed.  History of Present Illness   The patient presents with a chief complaint of a persistent scratchy throat and voice change, which has been ongoing for an extended period. He reports no current treatment for acid reflux, which he had previously experienced and treated. The patient also mentions occasional coughing and sneezing.  In addition to these symptoms, the patient has a history of COPD, which appears to be well-controlled. He reports infrequent use of his albuterol inhaler and regular use of his Breztri. He also has a history of hypertension and diabetes, for which he takes metformin once daily without any reported side effects such as diarrhea. He also takes simvastatin for cholesterol management without any reported muscle aches or pain.  The patient also reports some swelling in his legs, which has been ongoing for a while. However, he denies any chest pain or headaches. He also denies any increased thirst, hunger, or urination. He has been checking his blood sugars at home, which have been within normal limits.  The patient is not currently taking any vitamins, despite having been low on vitamin B12 and vitamin D in the past. He is also on medication for pain management, which he reports is effective and does not cause constipation.           Relevant past medical, surgical, family, and social history reviewed and updated as indicated.  Allergies and medications reviewed and updated. Data reviewed: Chart in Epic.   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   SHOULDER SURGERY Bilateral     Social History   Socioeconomic History   Marital status: Married    Spouse name: Not on file   Number of children: Not on file   Years of education: Not on file   Highest education level: Not on file  Occupational History   Occupation: Retired  Tobacco Use   Smoking status: Former    Current packs/day: 0.00    Average packs/day: 2.0 packs/day for 26.0 years (52.0 ttl pk-yrs)    Types: Cigarettes    Start date: 16  Quit date: 2018    Years since quitting: 6.9   Smokeless tobacco: Never  Vaping Use   Vaping status: Never Used  Substance and Sexual Activity   Alcohol use: Not Currently   Drug use: Never   Sexual activity: Yes  Other Topics Concern   Not on file  Social History Narrative   ** Merged History Encounter **       Social Drivers of Health   Financial Resource Strain: Patient Declined (10/15/2023)   Overall Financial Resource Strain (CARDIA)    Difficulty of Paying Living Expenses: Patient declined  Food Insecurity: Patient Declined (10/15/2023)   Hunger Vital Sign    Worried About Running Out of Food in the Last Year: Patient  declined    Ran Out of Food in the Last Year: Patient declined  Transportation Needs: Patient Declined (10/15/2023)   PRAPARE - Administrator, Civil Service (Medical): Patient declined    Lack of Transportation (Non-Medical): Patient declined  Physical Activity: Unknown (10/15/2023)   Exercise Vital Sign    Days of Exercise per Week: Patient declined    Minutes of Exercise per Session: 0 min  Stress: Patient Declined (10/15/2023)   [employer] of Occupational Health - Occupational Stress Questionnaire    Feeling of Stress : Patient declined  Social Connections: Unknown (10/15/2023)   Social Connection and Isolation Panel [NHANES]    Frequency of Communication with Friends and Family: Patient declined    Frequency of Social Gatherings with Friends and Family: Patient declined    Attends Religious Services: Patient declined    Database administrator or Organizations: Patient declined    Attends Banker Meetings: Never    Marital Status: Patient declined  Intimate Partner Violence: Not At Risk (02/19/2023)   Humiliation, Afraid, Rape, and Kick questionnaire    Fear of Current or Ex-Partner: No    Emotionally Abused: No    Physically Abused: No    Sexually Abused: No    Outpatient Encounter Medications as of 10/31/2023  Medication Sig   albuterol (VENTOLIN HFA) 108 (90 Base) MCG/ACT inhaler Inhale 2 puffs into the lungs every 6 (six) hours as needed for wheezing or shortness of breath.   aspirin 81 MG EC tablet Take 1 tablet by mouth daily.   Budeson-Glycopyrrol-Formoterol (BREZTRI AEROSPHERE) 160-9-4.8 MCG/ACT AERO Inhale 2 puffs into the lungs 2 (two) times daily.   buPROPion (WELLBUTRIN SR) 150 MG 12 hr tablet Take 1 tablet (150 mg total) by mouth every morning.   fluticasone (FLONASE) 50 MCG/ACT nasal spray Place 2 sprays into both nostrils daily.   HYDROcodone-acetaminophen (NORCO/VICODIN) 5-325 MG tablet Take 1 tablet by mouth 2 (two) times daily.    levocetirizine (XYZAL) 5 MG tablet Take 1 tablet (5 mg total) by mouth every evening.   metFORMIN (GLUCOPHAGE) 500 MG tablet Take 1 tablet (500 mg total) by mouth daily with breakfast.   naloxone (NARCAN) nasal spray 4 mg/0.1 mL Place into the nose.   [DISCONTINUED] omeprazole (PRILOSEC) 20 MG capsule Take 1 capsule (20 mg total) by mouth daily.   [DISCONTINUED] simvastatin (ZOCOR) 40 MG tablet TAKE 1 TABLET BY MOUTH EVERY DAY   simvastatin (ZOCOR) 40 MG tablet Take 1 tablet (40 mg total) by mouth daily.   [DISCONTINUED] benzonatate (TESSALON PERLES) 100 MG capsule Take 1 capsule (100 mg total) by mouth 3 (three) times daily as needed for cough.   [DISCONTINUED] Vitamin D, Ergocalciferol, (DRISDOL) 1.25 MG (50000 UNIT) CAPS capsule Take 1 capsule (  50,000 Units total) by mouth every 7 (seven) days.   No facility-administered encounter medications on file as of 10/31/2023.    Allergies  Allergen Reactions   Bee Venom Swelling   Other Hives    In laundry detergents   In laundry detergents   Sulfa Antibiotics Swelling   Sodium Hypochlorite Rash    Pertinent ROS per HPI, otherwise unremarkable      Objective:  BP 119/72   Pulse 73   Temp 97.7 F (36.5 C)   Ht 5\' 6"  (1.676 m)   Wt 278 lb 12.8 oz (126.5 kg)   SpO2 95%   BMI 45.00 kg/m    Wt Readings from Last 3 Encounters:  10/31/23 278 lb 12.8 oz (126.5 kg)  10/15/23 276 lb (125.2 kg)  09/10/23 273 lb (123.8 kg)    Physical Exam Vitals and nursing note reviewed.  Constitutional:      General: He is not in acute distress.    Appearance: Normal appearance. He is well-developed and well-groomed. He is morbidly obese. He is not ill-appearing, toxic-appearing or diaphoretic.  HENT:     Head: Normocephalic and atraumatic.     Jaw: There is normal jaw occlusion.     Right Ear: Hearing, tympanic membrane, ear canal and external ear normal.     Left Ear: Hearing, tympanic membrane, ear canal and external ear normal.     Nose:  Nose normal.     Mouth/Throat:     Lips: Pink.     Mouth: Mucous membranes are moist.     Pharynx: Oropharynx is clear. Uvula midline.  Eyes:     General: Lids are normal.     Extraocular Movements: Extraocular movements intact.     Conjunctiva/sclera: Conjunctivae normal.     Pupils: Pupils are equal, round, and reactive to light.  Neck:     Thyroid: No thyroid mass, thyromegaly or thyroid tenderness.     Vascular: No carotid bruit or JVD.     Trachea: Trachea and phonation normal.  Cardiovascular:     Rate and Rhythm: Normal rate and regular rhythm.     Chest Wall: PMI is not displaced.     Pulses:          Dorsalis pedis pulses are 2+ on the right side and 2+ on the left side.       Posterior tibial pulses are 2+ on the right side and 2+ on the left side.     Heart sounds: Normal heart sounds. No murmur heard.    No friction rub. No gallop.  Pulmonary:     Effort: Pulmonary effort is normal. Prolonged expiration present. No respiratory distress.     Breath sounds: Wheezing present.  Chest:     Chest wall: No mass.  Breasts:    Breasts are symmetrical.  Abdominal:     General: Abdomen is flat. Bowel sounds are normal. There is no distension or abdominal bruit.     Palpations: Abdomen is soft. There is no hepatomegaly or splenomegaly.     Tenderness: There is no abdominal tenderness. There is no right CVA tenderness or left CVA tenderness.     Hernia: No hernia is present.  Musculoskeletal:        General: Normal range of motion.     Cervical back: Full passive range of motion without pain, normal range of motion and neck supple.     Right lower leg: Edema present.     Left lower leg: Edema present.  Feet:     Right foot:     Skin integrity: Skin integrity normal.     Toenail Condition: Right toenails are normal.     Left foot:     Skin integrity: Skin integrity normal.     Toenail Condition: Left toenails are normal.  Lymphadenopathy:     Cervical: No cervical  adenopathy.     Upper Body:     Right upper body: No supraclavicular, axillary or pectoral adenopathy.     Left upper body: No supraclavicular, axillary or pectoral adenopathy.  Skin:    General: Skin is warm and dry.     Capillary Refill: Capillary refill takes less than 2 seconds.     Coloration: Skin is not cyanotic, jaundiced or pale.     Findings: No rash.  Neurological:     General: No focal deficit present.     Mental Status: He is alert and oriented to person, place, and time.     Sensory: Sensation is intact.     Motor: Motor function is intact.     Coordination: Coordination is intact.     Gait: Gait is intact.     Deep Tendon Reflexes: Reflexes are normal and symmetric.  Psychiatric:        Attention and Perception: Attention and perception normal.        Mood and Affect: Mood and affect normal.        Speech: Speech normal.        Behavior: Behavior normal. Behavior is cooperative.        Thought Content: Thought content normal.        Cognition and Memory: Cognition and memory normal.        Judgment: Judgment normal.    Physical Exam   HEENT: Fluid behind ears noted, indicative of middle ear effusion. Throat exhibits cobblestone appearance. EXTREMITIES: Mild leg edema present, non-pitting.        Results for orders placed or performed in visit on 08/26/23  Rapid Strep Screen ([facility] ONLY)   Collection Time: 08/26/23  3:23 PM   Specimen: Other   Other  Result Value Ref Range   Strep Gp A Ag, IA W/Reflex Negative Negative  COVID-19, Flu A+B and RSV   Collection Time: 08/26/23  3:23 PM   Specimen: Nasopharyngeal(NP) swabs in vial transport medium  Result Value Ref Range   SARS-CoV-2, NAA Not Detected Not Detected   Influenza A, NAA Not Detected Not Detected   Influenza B, NAA Not Detected Not Detected   RSV, NAA Not Detected Not Detected   Test Information: Comment   Culture, Group A Strep   Collection Time: 08/26/23  3:23 PM   Other  Result  Value Ref Range   Strep A Culture CANCELED        Pertinent labs & imaging results that were available during my care of the patient were reviewed by me and considered in my medical decision making.  Assessment & Plan:  Kenzell was seen today for medical management of chronic issues.  Diagnoses and all orders for this visit:  Morbid obesity (HCC) -     Anemia Profile B -     CMP14+EGFR -     Lipid panel -     Thyroid Panel With TSH -     VITAMIN D 25 Hydroxy (Vit-D Deficiency, Fractures) -     Bayer DCA Hb A1c Waived  Mixed hyperlipidemia -     simvastatin (ZOCOR) 40 MG tablet;  Take 1 tablet (40 mg total) by mouth daily. -     CMP14+EGFR -     Lipid panel  Aortic atherosclerosis (HCC) -     simvastatin (ZOCOR) 40 MG tablet; Take 1 tablet (40 mg total) by mouth daily. -     Lipid panel  Prediabetes -     simvastatin (ZOCOR) 40 MG tablet; Take 1 tablet (40 mg total) by mouth daily. -     CMP14+EGFR -     Bayer DCA Hb A1c Waived  B12 nutritional deficiency -     Anemia Profile B -     Thyroid Panel With TSH  Vitamin D deficiency -     CMP14+EGFR -     VITAMIN D 25 Hydroxy (Vit-D Deficiency, Fractures)  COPD mixed type (HCC) Doing well on current regimen.  Primary hypertension -     Anemia Profile B -     CMP14+EGFR -     Lipid panel -     Thyroid Panel With TSH  Seasonal allergic rhinitis due to pollen -     levocetirizine (XYZAL) 5 MG tablet; Take 1 tablet (5 mg total) by mouth every evening. -     fluticasone (FLONASE) 50 MCG/ACT nasal spray; Place 2 sprays into both nostrils daily.    Assessment and Plan    Allergic Rhinitis   Chronic scratchy throat, cough, and sneezing likely due to postnasal drainage and allergies. Physical exam reveals cobblestoning in the oropharynx and middle ear effusion. Discussed that allergies are the probable cause and that treatment with Xyzal and Flonase should alleviate symptoms. If symptoms persist, GERD will be reconsidered.    - Prescribe Xyzal nightly   - Prescribe Flonase nasal spray daily   - Reassess if symptoms persist and consider omeprazole for GERD if no improvement    Gastroesophageal Reflux Disease (GERD)   Potential cause of sore throat and cough if not controlled. Discussed that if allergy treatment does not resolve symptoms, omeprazole will be considered. If no improvement after 2-3 weeks, referral to ENT for further evaluation will be made.   - Hold off on omeprazole for now   - Consider omeprazole if allergy treatment does not resolve symptoms   - Refer to ENT if no improvement after 2-3 weeks    Chronic Obstructive Pulmonary Disease (COPD)   Well-controlled with Breztri. Rarely uses albuterol inhaler for breakthrough symptoms. No recent exacerbations reported.   - Continue Breztri as prescribed    Type 2 Diabetes Mellitus   Blood sugar levels are well-controlled with metformin. No symptoms of polyuria, polydipsia, or polyphagia. Recent blood sugar reading was 94 mg/dL.   - Continue metformin once daily   - Check A1c levels    Hyperlipidemia   Managed with simvastatin. No myalgia reported. Refill needed.   - Refill simvastatin prescription    Vitamin B12 and Vitamin D Deficiency   Will check current levels to ensure he is within normal range.   - Check vitamin B12 and vitamin D levels    General Health Maintenance   Routine health maintenance and lab work. No new issues reported.   - Order comprehensive lab work including A1c, vitamin B12, and vitamin D levels   - Schedule complete physical exam in six months.          Continue all other maintenance medications.  Follow up plan: Return in about 6 months (around 04/30/2024) for Annual Physical.   Continue healthy lifestyle choices, including diet (rich in fruits, vegetables,  and lean proteins, and low in salt and simple carbohydrates) and exercise (at least 30 minutes of moderate physical activity daily).  Educational handout  given for GERD  The above assessment and management plan was discussed with the patient. The patient verbalized understanding of and has agreed to the management plan. Patient is aware to call the clinic if they develop any new symptoms or if symptoms persist or worsen. Patient is aware when to return to the clinic for a follow-up visit. Patient educated on when it is appropriate to go to the emergency department.   Kari Baars, FNP-C [facility] [phone]

## 2023-11-01 LAB — ANEMIA PROFILE B
Basophils Absolute: 0.1 10*3/uL (ref 0.0–0.2)
Basos: 1 %
EOS (ABSOLUTE): 1.2 10*3/uL — ABNORMAL HIGH (ref 0.0–0.4)
Eos: 11 %
Ferritin: 138 ng/mL (ref 30–400)
Folate: 6.2 ng/mL (ref >3.0)
Hematocrit: 44.7 % (ref 37.5–51.0)
Hemoglobin: 15 g/dL (ref 13.0–17.7)
Immature Grans (Abs): 0.1 10*3/uL (ref 0.0–0.1)
Immature Granulocytes: 1 %
Iron Saturation: 18 % (ref 15–55)
Iron: 57 ug/dL (ref 38–169)
Lymphocytes Absolute: 2.8 10*3/uL (ref 0.7–3.1)
Lymphs: 27 %
MCH: 28.8 pg (ref 26.6–33.0)
MCHC: 33.6 g/dL (ref 31.5–35.7)
MCV: 86 fL (ref 79–97)
Monocytes Absolute: 0.8 10*3/uL (ref 0.1–0.9)
Monocytes: 8 %
Neutrophils Absolute: 5.6 10*3/uL (ref 1.4–7.0)
Neutrophils: 52 %
Platelets: 289 10*3/uL (ref 150–450)
RBC: 5.2 x10E6/uL (ref 4.14–5.80)
RDW: 13.5 % (ref 11.6–15.4)
Retic Ct Pct: 1.4 % (ref 0.6–2.6)
Total Iron Binding Capacity: 309 ug/dL (ref 250–450)
UIBC: 252 ug/dL (ref 111–343)
Vitamin B-12: 504 pg/mL (ref 232–1245)
WBC: 10.6 10*3/uL (ref 3.4–10.8)

## 2023-11-01 LAB — THYROID PANEL WITH TSH
Free Thyroxine Index: 2.2 (ref 1.2–4.9)
T3 Uptake Ratio: 29 % (ref 24–39)
T4, Total: 7.7 ug/dL (ref 4.5–12.0)
TSH: 1.6 u[IU]/mL (ref 0.450–4.500)

## 2023-11-01 LAB — CMP14+EGFR
ALT: 17 IU/L (ref 0–44)
AST: 17 IU/L (ref 0–40)
Albumin: 4.4 g/dL (ref 3.9–4.9)
Alkaline Phosphatase: 121 IU/L (ref 44–121)
BUN/Creatinine Ratio: 14 (ref 10–24)
BUN: 15 mg/dL (ref 8–27)
Bilirubin Total: 0.6 mg/dL (ref 0.0–1.2)
CO2: 21 mmol/L (ref 20–29)
Calcium: 9 mg/dL (ref 8.6–10.2)
Chloride: 104 mmol/L (ref 96–106)
Creatinine, Ser: 1.11 mg/dL (ref 0.76–1.27)
Globulin, Total: 2.8 g/dL (ref 1.5–4.5)
Glucose: 92 mg/dL (ref 70–99)
Potassium: 4.5 mmol/L (ref 3.5–5.2)
Sodium: 141 mmol/L (ref 134–144)
Total Protein: 7.2 g/dL (ref 6.0–8.5)
eGFR: 75 mL/min/{1.73_m2} (ref >59)

## 2023-11-01 LAB — LIPID PANEL
Cholesterol, Total: 154 mg/dL (ref 100–199)
HDL: 52 mg/dL (ref >39)
LDL CALC COMMENT:: 3 ratio (ref 0.0–5.0)
LDL Chol Calc (NIH): 86 mg/dL (ref 0–99)
Triglycerides: 83 mg/dL (ref 0–149)
VLDL Cholesterol Cal: 16 mg/dL (ref 5–40)

## 2023-11-01 LAB — VITAMIN D 25 HYDROXY (VIT D DEFICIENCY, FRACTURES): Vit D, 25-Hydroxy: 39.5 ng/mL (ref 30.0–100.0)

## 2023-11-06 ENCOUNTER — Other Ambulatory Visit: Payer: Self-pay | Admitting: Family Medicine

## 2023-11-06 DIAGNOSIS — J441 Chronic obstructive pulmonary disease with (acute) exacerbation: Secondary | ICD-10-CM

## 2023-11-07 ENCOUNTER — Encounter: Payer: Self-pay | Admitting: Family Medicine

## 2023-11-10 ENCOUNTER — Encounter: Payer: Self-pay | Admitting: *Deleted

## 2023-11-10 ENCOUNTER — Other Ambulatory Visit: Payer: Self-pay | Admitting: Family Medicine

## 2023-11-10 DIAGNOSIS — J029 Acute pharyngitis, unspecified: Secondary | ICD-10-CM

## 2023-11-10 MED ORDER — OMEPRAZOLE 20 MG PO CPDR: 20.0000 mg | DELAYED_RELEASE_CAPSULE | Freq: Every day | ORAL | 0 refills | Status: DC

## 2023-11-14 ENCOUNTER — Encounter: Payer: Self-pay | Admitting: Family Medicine

## 2023-11-18 ENCOUNTER — Ambulatory Visit (INDEPENDENT_AMBULATORY_CARE_PROVIDER_SITE_OTHER): Payer: Medicare PPO | Admitting: Family Medicine

## 2023-11-18 ENCOUNTER — Encounter: Payer: Self-pay | Admitting: Family Medicine

## 2023-11-18 VITALS — BP 117/62 | HR 71 | Ht 66.0 in | Wt 289.0 lb

## 2023-11-18 DIAGNOSIS — H5711 Ocular pain, right eye: Secondary | ICD-10-CM

## 2023-11-18 DIAGNOSIS — R519 Headache, unspecified: Secondary | ICD-10-CM

## 2023-11-18 MED ORDER — METHYLPREDNISOLONE ACETATE 80 MG/ML IJ SUSP
80.0000 mg | Freq: Once | INTRAMUSCULAR | Status: AC
  Administered 2023-11-18: 80 mg via INTRAMUSCULAR

## 2023-11-18 NOTE — Progress Notes (Signed)
 Subjective: CC:eye pain PCP: Severa Rock HERO, FNP YEP:Brian Steele is a 63 y.o. male presenting to clinic today for:  1. Eye pain Patient is accompanied today by his wife.  He reports a 4-day history of worsening of right sided eye pain and redness.  He is been having associated right sided headaches and points to the temporal region of the source of headache.  He denies any foreign body sensation, changes in vision to me however he did report some blurred vision intermittently with my nurse.  He denies any preceding injury.  Upon further discussion with his wife she notes that he has been having some right sided throat pain intermittently as well and this was thought to be secondary to GERD.  However, it is nonresolving.  Family history is significant for rheumatoid arthritis in his mother.  No known other autoimmune diseases in the family.  He notes that symptoms have been refractory to OTC analgesics as well as some antibiotic drops that were given to his wife by an optometrist once.  She is not quite sure the name of it.   ROS: Per HPI  Allergies  Allergen Reactions   Bee Venom Swelling   Other Hives    In laundry detergents   In laundry detergents   Sulfa Antibiotics Swelling   Sodium Hypochlorite Rash   Past Medical History:  Diagnosis Date   Allergy    Aortic atherosclerosis (HCC) 01/12/2022   Arthritis    Back pain    Barrett's esophagus    Bilateral swelling of feet    Chest pain    COPD (chronic obstructive pulmonary disease) (HCC)    Gallbladder problem    Hyperlipidemia    Obstructive sleep apnea    Prediabetes    SOB (shortness of breath)    Vitamin D  deficiency     Current Outpatient Medications:    albuterol  (VENTOLIN  HFA) 108 (90 Base) MCG/ACT inhaler, TAKE 2 PUFFS BY MOUTH EVERY 6 HOURS AS NEEDED FOR WHEEZE OR SHORTNESS OF BREATH, Disp: 18 each, Rfl: 4   aspirin 81 MG EC tablet, Take 1 tablet by mouth daily., Disp: , Rfl:     Budeson-Glycopyrrol-Formoterol (BREZTRI  AEROSPHERE) 160-9-4.8 MCG/ACT AERO, Inhale 2 puffs into the lungs 2 (two) times daily., Disp: 10.7 g, Rfl: 11   buPROPion  (WELLBUTRIN  SR) 150 MG 12 hr tablet, Take 1 tablet (150 mg total) by mouth every morning., Disp: 30 tablet, Rfl: 0   fluticasone  (FLONASE ) 50 MCG/ACT nasal spray, Place 2 sprays into both nostrils daily., Disp: 16 g, Rfl: 6   HYDROcodone-acetaminophen  (NORCO/VICODIN) 5-325 MG tablet, Take 1 tablet by mouth 2 (two) times daily., Disp: , Rfl:    levocetirizine (XYZAL ) 5 MG tablet, Take 1 tablet (5 mg total) by mouth every evening., Disp: 90 tablet, Rfl: 1   metFORMIN  (GLUCOPHAGE ) 500 MG tablet, Take 1 tablet (500 mg total) by mouth daily with breakfast., Disp: 90 tablet, Rfl: 0   naloxone (NARCAN) nasal spray 4 mg/0.1 mL, Place into the nose., Disp: , Rfl:    omeprazole  (PRILOSEC) 20 MG capsule, Take 1 capsule (20 mg total) by mouth daily. X2-4 weeks, Disp: 30 capsule, Rfl: 0   simvastatin  (ZOCOR ) 40 MG tablet, Take 1 tablet (40 mg total) by mouth daily., Disp: 90 tablet, Rfl: 1 Social History   Socioeconomic History   Marital status: Married    Spouse name: Not on file   Number of children: Not on file   Years of education: Not on file  Highest education level: Not on file  Occupational History   Occupation: Retired  Tobacco Use   Smoking status: Former    Current packs/day: 0.00    Average packs/day: 2.0 packs/day for 26.0 years (52.0 ttl pk-yrs)    Types: Cigarettes    Start date: 68    Quit date: 2018    Years since quitting: 7.0   Smokeless tobacco: Never  Vaping Use   Vaping status: Never Used  Substance and Sexual Activity   Alcohol use: Not Currently   Drug use: Never   Sexual activity: Yes  Other Topics Concern   Not on file  Social History Narrative   ** Merged History Encounter **       Social Drivers of Health   Financial Resource Strain: Patient Declined (10/15/2023)   Overall Financial Resource Strain  (CARDIA)    Difficulty of Paying Living Expenses: Patient declined  Food Insecurity: Patient Declined (10/15/2023)   Hunger Vital Sign    Worried About Running Out of Food in the Last Year: Patient declined    Ran Out of Food in the Last Year: Patient declined  Transportation Needs: Patient Declined (10/15/2023)   PRAPARE - Administrator, Civil Service (Medical): Patient declined    Lack of Transportation (Non-Medical): Patient declined  Physical Activity: Unknown (10/15/2023)   Exercise Vital Sign    Days of Exercise per Week: Patient declined    Minutes of Exercise per Session: 0 min  Stress: Patient Declined (10/15/2023)   [employer] of Occupational Health - Occupational Stress Questionnaire    Feeling of Stress : Patient declined  Social Connections: Unknown (10/15/2023)   Social Connection and Isolation Panel [NHANES]    Frequency of Communication with Friends and Family: Patient declined    Frequency of Social Gatherings with Friends and Family: Patient declined    Attends Religious Services: Patient declined    Database Administrator or Organizations: Patient declined    Attends Banker Meetings: Never    Marital Status: Patient declined  Intimate Partner Violence: Not At Risk (02/19/2023)   Humiliation, Afraid, Rape, and Kick questionnaire    Fear of Current or Ex-Partner: No    Emotionally Abused: No    Physically Abused: No    Sexually Abused: No   Family History  Problem Relation Age of Onset   Arthritis/Rheumatoid Mother    Cancer Father    Diabetes Father    Kidney cancer Father    Obesity Father    Hypertension Brother    Hyperlipidemia Brother    Obesity Daughter     Objective: Office vital signs reviewed. BP 117/62   Pulse 71   Ht 5' 6 (1.676 m)   Wt 289 lb (131.1 kg)   SpO2 97%   BMI 46.65 kg/m   Physical Examination:  General: Awake, alert, nontoxic male, No acute distress HEENT: Tenderness to palpation to the right  temporal region.  No palpable abnormalities, soft tissue swelling or erythema.  He has a small cluster of hyperemia with increased vascularity along the right lower quadrant of the sclera and the right eye. PERRL, EOMI. no gross periorbital swelling or deformity appreciated.  Assessment/ Plan: 63 y.o. male   Eye pain, right - Plan: CMP14+EGFR, CBC, Sedimentation Rate, C-reactive protein, Urinalysis, VITAMIN D  25 Hydroxy (Vit-D Deficiency, Fractures), Protein electrophoresis, serum, methylPREDNISolone  acetate (DEPO-MEDROL ) injection 80 mg, Ambulatory referral to Ophthalmology  Right temporal headache - Plan: CMP14+EGFR, CBC, Sedimentation Rate, C-reactive protein, Urinalysis, VITAMIN  D 25 Hydroxy (Vit-D Deficiency, Fractures), Protein electrophoresis, serum, methylPREDNISolone  acetate (DEPO-MEDROL ) injection 80 mg, Ambulatory referral to Ophthalmology  Concern for possible GCA and must rule this out.  I have collected appropriate labs.  I am going to get him urgently evaluated by ophthalmology tomorrow and have already communicated concerns to Dr. Maree with [facility] in [place].  He was given a high dose corticosteroid injection today and we will plan for oral corticosteroids pending lab results tomorrow and evaluation with ophthalmology.  We discussed differential diagnosis including iritis, atypical migraine, infection and of course GCA as above.   Norene CHRISTELLA Fielding, DO [facility] [phone]

## 2023-11-18 NOTE — Patient Instructions (Signed)
 I'm ruling out giant cell arteritis. You got a steroid shot today. Will be in contact with lab results once available.  Dr Georges Mouse At: [facility] [address] [place], [place] [address]  < 1 mi [phone]

## 2023-11-19 LAB — URINALYSIS
Bilirubin, UA: NEGATIVE
Glucose, UA: NEGATIVE
Ketones, UA: NEGATIVE
Leukocytes,UA: NEGATIVE
Nitrite, UA: NEGATIVE
Protein,UA: NEGATIVE
RBC, UA: NEGATIVE
Specific Gravity, UA: 1.025 (ref 1.005–1.030)
Urobilinogen, Ur: 0.2 mg/dL (ref 0.2–1.0)
pH, UA: 5.5 (ref 5.0–7.5)

## 2023-11-20 ENCOUNTER — Ambulatory Visit (INDEPENDENT_AMBULATORY_CARE_PROVIDER_SITE_OTHER): Payer: Medicare PPO | Admitting: Family Medicine

## 2023-11-20 ENCOUNTER — Encounter (INDEPENDENT_AMBULATORY_CARE_PROVIDER_SITE_OTHER): Payer: Self-pay | Admitting: Family Medicine

## 2023-11-20 VITALS — BP 148/65 | HR 82 | Ht 66.0 in | Wt 282.0 lb

## 2023-11-20 DIAGNOSIS — F5089 Other specified eating disorder: Secondary | ICD-10-CM | POA: Diagnosis not present

## 2023-11-20 DIAGNOSIS — Z6841 Body Mass Index (BMI) 40.0 and over, adult: Secondary | ICD-10-CM | POA: Diagnosis not present

## 2023-11-20 DIAGNOSIS — E669 Obesity, unspecified: Secondary | ICD-10-CM | POA: Diagnosis not present

## 2023-11-20 DIAGNOSIS — R7303 Prediabetes: Secondary | ICD-10-CM

## 2023-11-20 DIAGNOSIS — F3289 Other specified depressive episodes: Secondary | ICD-10-CM

## 2023-11-20 MED ORDER — METFORMIN HCL 500 MG PO TABS: 500.0000 mg | ORAL_TABLET | Freq: Every day | ORAL | 0 refills | Status: DC

## 2023-11-20 MED ORDER — BUPROPION HCL ER (SR) 150 MG PO TB12: 150.0000 mg | ORAL_TABLET | Freq: Every morning | ORAL | 0 refills | Status: DC

## 2023-11-20 NOTE — Progress Notes (Signed)
 .smr  Office: [phone]  /  Fax: [phone]  WEIGHT SUMMARY AND BIOMETRICS  Anthropometric Measurements Height: 5' 6 (1.676 m) Weight: 282 lb (127.9 kg) BMI (Calculated): 45.54 Weight at Last Visit: 273 lb Weight Lost Since Last Visit: 0 Weight Gained Since Last Visit: 9 lb Starting Weight: 288 lb Total Weight Loss (lbs): 6 lb (2.722 kg)   Body Composition  Body Fat %: 40.8 % Fat Mass (lbs): 115.2 lbs Muscle Mass (lbs): 159 lbs Total Body Water (lbs): 121.6 lbs Visceral Fat Rating : 29   Other Clinical Data Fasting: No Today's Visit #: 10 Starting Date: 04/16/23    Chief Complaint: OBESITY  History of Present Illness   The patient, with a history of prediabetes, emotional eating behaviors, and obesity, presents for a follow-up visit. He reports a weight gain of nine pounds over the past three months. Despite adhering to a category three eating plan approximately 80% of the time and engaging in walking exercises for thirty minutes three times a week, the patient's weight continues to increase.  The patient is currently on Wellbutrin  for emotional eating behaviors and Metformin  500mg  daily for prediabetes. He reports an elevated blood pressure of 155/83, which upon recheck, improved but remained high at 148/65.  The patient also reports a new issue of constant pain in the right eye, described as similar to a headache. This pain was diagnosed as dry eye by an ophthalmologist, and the patient notes that the discomfort may be exacerbated by air from his sleep apnea mask.  The patient's partner notes that the patient has been experiencing increased cravings and has deviated from the prescribed eating plan, particularly with regards to cereal and pasta intake. The patient acknowledges these dietary lapses and expresses a willingness to return to stricter adherence to the category three eating plan.          PHYSICAL EXAM:  Blood pressure (!) 148/65, pulse 82, height 5'  6 (1.676 m), weight 282 lb (127.9 kg), SpO2 96%. Body mass index is 45.52 kg/m.  DIAGNOSTIC DATA REVIEWED:  BMET    Component Value Date/Time   NA 141 11/18/2023 1615   K 4.2 11/18/2023 1615   CL 105 11/18/2023 1615   CO2 24 11/18/2023 1615   GLUCOSE 82 11/18/2023 1615   GLUCOSE 116 (H) 03/30/2009 1255   BUN 13 11/18/2023 1615   CREATININE 1.05 11/18/2023 1615   CALCIUM 9.2 11/18/2023 1615   GFRNONAA 80 06/22/2020 1219   GFRAA 93 06/22/2020 1219   Lab Results  Component Value Date   HGBA1C 5.3 10/31/2023   HGBA1C 6.1 01/18/2021   Lab Results  Component Value Date   INSULIN  29.1 (H) 08/20/2023   INSULIN  12.5 04/16/2023   Lab Results  Component Value Date   TSH 1.600 10/31/2023   CBC    Component Value Date/Time   WBC 9.7 11/18/2023 1615   WBC 9.8 12/11/2009 1446   RBC 4.85 11/18/2023 1615   RBC 5.46 (A) 02/24/2023 0000   HGB 14.0 11/18/2023 1615   HCT 42.1 11/18/2023 1615   PLT 276 11/18/2023 1615   MCV 87 11/18/2023 1615   MCH 28.9 11/18/2023 1615   MCHC 33.3 11/18/2023 1615   MCHC 34.5 12/11/2009 1446   RDW 13.5 11/18/2023 1615   Iron Studies    Component Value Date/Time   IRON 57 10/31/2023 0837   IRON 54 02/25/2023 0000   TIBC 309 10/31/2023 0837   TIBC 405.00 02/25/2023 0000   FERRITIN 138 10/31/2023 0837  FERRITIN 69.30 02/25/2023 0000   IRONPCTSAT 18 10/31/2023 0837   Lipid Panel     Component Value Date/Time   CHOL 154 10/31/2023 0837   TRIG 83 10/31/2023 0837   HDL 52 10/31/2023 0837   CHOLHDL 3.0 10/31/2023 0837   LDLCALC 86 10/31/2023 0837   Hepatic Function Panel     Component Value Date/Time   PROT 6.8 11/18/2023 1615   ALBUMIN 4.2 11/18/2023 1615   AST 18 11/18/2023 1615   ALT 18 11/18/2023 1615   ALKPHOS 100 11/18/2023 1615   BILITOT 0.5 11/18/2023 1615   BILIDIR 0.2 03/31/2009 0500   IBILI 1.1 (H) 03/31/2009 0500      Component Value Date/Time   TSH 1.600 10/31/2023 0837   Nutritional Lab Results  Component  Value Date   VD25OH 38.1 11/18/2023   VD25OH 39.5 10/31/2023   VD25OH 59.7 08/20/2023     Assessment and Plan    Obesity Obesity with recent weight gain of nine pounds. On a category three eating plan and walking for exercise. Discussed the impact of diet and exercise on weight management, emphasizing portion control and avoiding high-calorie, high-insulin  foods, especially after recent holiday indulgences. - Encourage adherence to the category three eating plan - Recommend increasing physical activity if possible - Emphasize portion control and avoiding high-calorie foods  Prediabetes Prediabetes managed with metformin  500 mg daily. Reports weight gain of nine pounds in the last three months. Currently adheres to the category three eating plan 80% of the time and walks for exercise 30 minutes three times per week. Discussed the importance of strict adherence to the eating plan and the role of metformin  in managing blood sugar and aiding weight loss. Emphasized avoiding high insulin -inducing foods like pasta and sugary cereals to prevent fat storage. - Refill metformin  500 mg daily - Encourage strict adherence to the category three eating plan - Recommend avoiding high insulin -inducing foods like pasta and sugary cereals - Provide a copy of the category three eating plan  Emotional Eating Behaviors Emotional eating behaviors managed with bupropion . Requests a refill. Discussed the role of bupropion  in reducing cravings and the importance of addressing emotional eating to aid weight loss. Recommended alternative yogurt options like Carb Master for better adherence to dietary recommendations. - Refill bupropion  - Encourage adherence to the eating plan to manage cravings - Recommend alternative yogurt options like Carb Master  Elevated Blood Pressure Elevated blood pressure readings of 155/83 and 148/65 without a history of hypertension. Possible contributing factor includes pain from dry  eye. Discussed the potential impact of pain on blood pressure and the importance of monitoring blood pressure at home. Advised that pain can raise blood pressure and emphasized the need to address underlying pain from dry eye. - Monitor blood pressure at home - Recheck blood pressure at the next visit - Address underlying pain from dry eye  General Health Maintenance Discussed the importance of regular follow-up visits and the best way to contact the doctor for medication refills and other concerns. Advised using non-urgent messages in My Chart app for direct communication. - Schedule follow-up appointment in four weeks - Use non-urgent messages in My Chart app for direct communication  Follow-up - Schedule follow-up appointment in four weeks.          He was informed of the importance of frequent follow up visits to maximize his success with intensive lifestyle modifications for his multiple health conditions.    [name], MD

## 2023-11-23 LAB — PROTEIN ELECTROPHORESIS, SERUM
A/G Ratio: 1.3 (ref 0.7–1.7)
Albumin ELP: 3.8 g/dL (ref 2.9–4.4)
Alpha 1: 0.2 g/dL (ref 0.0–0.4)
Alpha 2: 0.7 g/dL (ref 0.4–1.0)
Beta: 1 g/dL (ref 0.7–1.3)
Gamma Globulin: 1 g/dL (ref 0.4–1.8)
Globulin, Total: 3 g/dL (ref 2.2–3.9)

## 2023-11-23 LAB — C-REACTIVE PROTEIN: CRP: 6 mg/L (ref 0–10)

## 2023-11-23 LAB — CMP14+EGFR
ALT: 18 [IU]/L (ref 0–44)
AST: 18 [IU]/L (ref 0–40)
Albumin: 4.2 g/dL (ref 3.9–4.9)
Alkaline Phosphatase: 100 [IU]/L (ref 44–121)
BUN/Creatinine Ratio: 12 (ref 10–24)
BUN: 13 mg/dL (ref 8–27)
Bilirubin Total: 0.5 mg/dL (ref 0.0–1.2)
CO2: 24 mmol/L (ref 20–29)
Calcium: 9.2 mg/dL (ref 8.6–10.2)
Chloride: 105 mmol/L (ref 96–106)
Creatinine, Ser: 1.05 mg/dL (ref 0.76–1.27)
Globulin, Total: 2.6 g/dL (ref 1.5–4.5)
Glucose: 82 mg/dL (ref 70–99)
Potassium: 4.2 mmol/L (ref 3.5–5.2)
Sodium: 141 mmol/L (ref 134–144)
Total Protein: 6.8 g/dL (ref 6.0–8.5)
eGFR: 80 mL/min/{1.73_m2} (ref >59)

## 2023-11-23 LAB — CBC
Hematocrit: 42.1 % (ref 37.5–51.0)
Hemoglobin: 14 g/dL (ref 13.0–17.7)
MCH: 28.9 pg (ref 26.6–33.0)
MCHC: 33.3 g/dL (ref 31.5–35.7)
MCV: 87 fL (ref 79–97)
Platelets: 276 10*3/uL (ref 150–450)
RBC: 4.85 x10E6/uL (ref 4.14–5.80)
RDW: 13.5 % (ref 11.6–15.4)
WBC: 9.7 10*3/uL (ref 3.4–10.8)

## 2023-11-23 LAB — VITAMIN D 25 HYDROXY (VIT D DEFICIENCY, FRACTURES): Vit D, 25-Hydroxy: 38.1 ng/mL (ref 30.0–100.0)

## 2023-11-23 LAB — SEDIMENTATION RATE: Sed Rate: 33 mm/h — ABNORMAL HIGH (ref 0–30)

## 2023-12-02 ENCOUNTER — Ambulatory Visit: Payer: Medicare PPO | Admitting: Family Medicine

## 2023-12-10 ENCOUNTER — Encounter: Payer: Self-pay | Admitting: Family Medicine

## 2023-12-10 ENCOUNTER — Ambulatory Visit (INDEPENDENT_AMBULATORY_CARE_PROVIDER_SITE_OTHER): Payer: Medicare PPO | Admitting: Family Medicine

## 2023-12-10 VITALS — BP 109/61 | HR 95 | Temp 98.0°F | Ht 66.0 in | Wt 284.0 lb

## 2023-12-10 DIAGNOSIS — Q809 Congenital ichthyosis, unspecified: Secondary | ICD-10-CM

## 2023-12-10 DIAGNOSIS — H5711 Ocular pain, right eye: Secondary | ICD-10-CM | POA: Diagnosis not present

## 2023-12-10 NOTE — Progress Notes (Signed)
Subjective:  Patient ID: Brian Steele, male    DOB: 1961/07/03, 63 y.o.   MRN: [id number]  Patient Care Team: Sonny Masters, FNP as PCP - General (Family Medicine) Kizzie Bane as Physician Assistant (Physician Assistant) Verlin Dike., MD as Referring Physician (Sports Medicine)   Chief Complaint:  Conjunctivitis (Pain and redness gone. )   HPI: Brian Steele is a 63 y.o. male presenting on 12/10/2023 for Conjunctivitis (Pain and redness gone. )   Discussed the use of AI scribe software for clinical note transcription with the patient, who gave verbal consent to proceed.  History of Present Illness   Pt presents today for reevaluation of tenderness and pain around the right eye. He was sent to opthalmology by Dr Nadine Counts due to concerns for giant cell arteritis. He is here today for follow up.   He has been diagnosed with dry eye and early-stage glaucoma by an eye doctor. The intraocular pressure was noted to be around twenty percent. He is scheduled for a follow-up in six months. A previous sed rate test was minimally elevated, but all symptoms have resolved. No current pain or tenderness around the eye, and vision is stable.  He initially experienced tenderness and pain around the right eye, which was evaluated by an eye doctor. Currently, there is no pain or tenderness around the eye, and his vision is stable. No tenderness around the eye or temple area and no pain with eye movement.  He exhibits a behavior of scratching his skin until sores develop, which then take a long time to heal. He attributes this to dry skin, describing it as 'probably just my dry skin or something.' He does not use any lotions and denies feeling anxious, suggesting that the behavior might be due to dry skin. His family member notes that he nervously scratches, leading to scabs and sores. He acknowledges being a 'picker' and has super dry skin.          Relevant past medical, surgical,  family, and social history reviewed and updated as indicated.  Allergies and medications reviewed and updated. Data reviewed: Chart in Epic.   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   SHOULDER SURGERY Bilateral     Social History   Socioeconomic History   Marital status: Married    Spouse name: Not on file   Number of children: Not on file   Years of education: Not on file   Highest education level: Not on file  Occupational History   Occupation: Retired  Tobacco Use   Smoking status: Former    Current packs/day: 0.00    Average packs/day: 2.0 packs/day for 26.0 years (52.0 ttl pk-yrs)    Types: Cigarettes  Start date: 82    Quit date: 2018    Years since quitting: 7.0   Smokeless tobacco: Never  Vaping Use   Vaping status: Never Used  Substance and Sexual Activity   Alcohol use: Not Currently   Drug use: Never   Sexual activity: Yes  Other Topics Concern   Not on file  Social History Narrative   ** Merged History Encounter **       Social Drivers of Health   Financial Resource Strain: Patient Declined (10/15/2023)   Overall Financial Resource Strain (CARDIA)    Difficulty of Paying Living Expenses: Patient declined  Food Insecurity: Patient Declined (10/15/2023)   Hunger Vital Sign    Worried About Running Out of Food in the Last Year: Patient declined    Ran Out of Food in the Last  Year: Patient declined  Transportation Needs: Patient Declined (10/15/2023)   PRAPARE - Administrator, Civil Service (Medical): Patient declined    Lack of Transportation (Non-Medical): Patient declined  Physical Activity: Unknown (10/15/2023)   Exercise Vital Sign    Days of Exercise per Week: Patient declined    Minutes of Exercise per Session: 0 min  Stress: Patient Declined (10/15/2023)   [employer] of Occupational Health - Occupational Stress Questionnaire    Feeling of Stress : Patient declined  Social Connections: Unknown (10/15/2023)   Social Connection and Isolation Panel [NHANES]    Frequency of Communication with Friends and Family: Patient declined    Frequency of Social Gatherings with Friends and Family: Patient declined    Attends Religious Services: Patient declined    Database administrator or Organizations: Patient declined    Attends Banker Meetings: Never    Marital Status: Patient declined  Intimate Partner Violence: Not At Risk (02/19/2023)   Humiliation, Afraid, Rape, and Kick questionnaire    Fear of Current or Ex-Partner: No    Emotionally Abused: No    Physically Abused: No    Sexually Abused: No    Outpatient Encounter Medications as of 12/10/2023  Medication Sig   albuterol (VENTOLIN HFA) 108 (90 Base) MCG/ACT inhaler TAKE 2 PUFFS BY MOUTH EVERY 6 HOURS AS NEEDED FOR WHEEZE OR SHORTNESS OF BREATH   aspirin 81 MG EC tablet Take 1 tablet by mouth daily.   Budeson-Glycopyrrol-Formoterol (BREZTRI AEROSPHERE) 160-9-4.8 MCG/ACT AERO Inhale 2 puffs into the lungs 2 (two) times daily.   buPROPion (WELLBUTRIN SR) 150 MG 12 hr tablet Take 1 tablet (150 mg total) by mouth every morning.   fluticasone (FLONASE) 50 MCG/ACT nasal spray Place 2 sprays into both nostrils daily.   HYDROcodone-acetaminophen (NORCO/VICODIN) 5-325 MG tablet Take 1 tablet by mouth 2 (two) times daily.   levocetirizine (XYZAL) 5 MG tablet Take 1 tablet (5 mg total)  by mouth every evening.   metFORMIN (GLUCOPHAGE) 500 MG tablet Take 1 tablet (500 mg total) by mouth daily with breakfast.   naloxone (NARCAN) nasal spray 4 mg/0.1 mL Place into the nose.   omeprazole (PRILOSEC) 20 MG capsule Take 1 capsule (20 mg total) by mouth daily. X2-4 weeks   simvastatin (ZOCOR) 40 MG tablet Take 1 tablet (40 mg total) by mouth daily.   No facility-administered encounter medications on file as of 12/10/2023.    Allergies  Allergen Reactions   Bee Venom Swelling   Other Hives    In laundry detergents   In laundry detergents   Sulfa Antibiotics Swelling   Sodium Hypochlorite Rash  Pertinent ROS per HPI, otherwise unremarkable      Objective:  BP 109/61   Pulse 95   Temp 98 F (36.7 C)   Ht 5\' 6"  (1.676 m)   Wt 284 lb (128.8 kg)   SpO2 99%   BMI 45.84 kg/m    Wt Readings from Last 3 Encounters:  12/10/23 284 lb (128.8 kg)  11/20/23 282 lb (127.9 kg)  11/18/23 289 lb (131.1 kg)    Physical Exam Vitals and nursing note reviewed.  Constitutional:      General: He is not in acute distress.    Appearance: Normal appearance. He is obese. He is not ill-appearing, toxic-appearing or diaphoretic.  HENT:     Head: Normocephalic and atraumatic.     Nose: Nose normal.     Mouth/Throat:     Mouth: Mucous membranes are moist.  Eyes:     General: Lids are normal. No allergic shiner, visual field deficit or scleral icterus.       Right eye: No discharge.        Left eye: No discharge.     Extraocular Movements: Extraocular movements intact.     Right eye: Normal extraocular motion and no nystagmus.     Left eye: Normal extraocular motion and no nystagmus.     Conjunctiva/sclera: Conjunctivae normal.     Pupils: Pupils are equal, round, and reactive to light.  Skin:    General: Skin is warm and dry.     Capillary Refill: Capillary refill takes less than 2 seconds.     Findings: Wound present.          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    Neurological:     Mental Status: He is alert.     Results for orders placed or performed in visit on 11/18/23  CMP14+EGFR   Collection Time: 11/18/23  4:15 PM  Result Value Ref Range   Glucose 82 70 - 99 mg/dL   BUN 13 8 - 27 mg/dL   Creatinine, Ser 4.09 0.76 - 1.27 mg/dL   eGFR 80 >81 XB/JYN/8.29   BUN/Creatinine Ratio 12 10 - 24   Sodium 141 134 - 144 mmol/L   Potassium 4.2 3.5 - 5.2 mmol/L   Chloride 105 96 - 106 mmol/L   CO2 24 20 - 29 mmol/L   Calcium 9.2 8.6 - 10.2 mg/dL   Total Protein 6.8 6.0 - 8.5 g/dL   Albumin 4.2 3.9 - 4.9 g/dL   Globulin, Total 2.6 1.5 - 4.5 g/dL   Bilirubin Total 0.5 0.0 - 1.2 mg/dL   Alkaline Phosphatase 100 44 - 121 IU/L   AST 18 0 - 40 IU/L   ALT 18 0 - 44 IU/L  CBC   Collection Time: 11/18/23  4:15 PM  Result Value Ref Range   WBC 9.7 3.4 - 10.8 x10E3/uL   RBC 4.85 4.14 - 5.80 x10E6/uL   Hemoglobin 14.0 13.0 - 17.7 g/dL   Hematocrit 56.2 13.0 - 51.0 %   MCV 87 79 - 97 fL   MCH 28.9 26.6 - 33.0 pg   MCHC 33.3 31.5 - 35.7 g/dL   RDW 86.5 78.4 - 69.6 %   Platelets 276 150 - 450 x10E3/uL  Sedimentation Rate   Collection Time: 11/18/23  4:15 PM  Result Value Ref Range   Sed Rate 33 (H) 0 - 30 mm/hr  C-reactive protein   Collection Time: 11/18/23  4:15 PM  Result Value Ref Range   CRP 6  0 - 10 mg/L  VITAMIN D 25 Hydroxy (Vit-D Deficiency, Fractures)   Collection Time: 11/18/23  4:15 PM  Result Value Ref Range   Vit D, 25-Hydroxy 38.1 30.0 - 100.0 ng/mL  Protein electrophoresis, serum   Collection Time: 11/18/23  4:15 PM  Result Value Ref Range   Albumin ELP 3.8 2.9 - 4.4 g/dL   Alpha 1 0.2 0.0 - 0.4 g/dL   Alpha 2 0.7 0.4 - 1.0 g/dL   Beta 1.0 0.7 - 1.3 g/dL   Gamma Globulin 1.0 0.4 - 1.8 g/dL   M-Spike, % Not Observed Not Observed g/dL   Globulin, Total 3.0 2.2 - 3.9 g/dL   A/G Ratio 1.3 0.7 - 1.7   Please Note: Comment    Interpretation: Comment   Urinalysis   Collection Time: 11/18/23  4:19 PM  Result Value Ref Range    Specific Gravity, UA 1.025 1.005 - 1.030   pH, UA 5.5 5.0 - 7.5   Color, UA Yellow Yellow   Appearance Ur Clear Clear   Leukocytes,UA Negative Negative   Protein,UA Negative Negative/Trace   Glucose, UA Negative Negative   Ketones, UA Negative Negative   RBC, UA Negative Negative   Bilirubin, UA Negative Negative   Urobilinogen, Ur 0.2 0.2 - 1.0 mg/dL   Nitrite, UA Negative Negative       Pertinent labs & imaging results that were available during my care of the patient were reviewed by me and considered in my medical decision making.  Assessment & Plan:  Wylan was seen today for conjunctivitis.  Diagnoses and all orders for this visit:  Eye pain, right Resolved.   Xeroderma Symptomatic care discussed in detail.     Assessment and Plan    Early Stage Glaucoma Diagnosed with early stage glaucoma by the ophthalmologist. Intraocular pressure approximately 20%. Asymptomatic with no ocular pain or tenderness. Sed rate minimally elevated; temporal arteritis ruled out. - Maintain six-month follow-up with ophthalmologist - Report any recurrence of ocular pain or tenderness immediately  Xeroderma (Dry Skin) Presents with xeroderma, characterized by dry skin and excoriations. Anxiety may contribute to scratching behavior. Advised on the use of Equate Eczema (2% colloid oatmeal) for skin hydration and repair, and over-the-counter hydrocortisone cream for pruritus. - Apply Equate Eczema (2% colloid oatmeal) twice daily until skin barrier is restored, then reduce to once daily - Consider over-the-counter hydrocortisone cream for pruritus - Educate on the importance of moisturizing and avoiding scratching.          Continue all other maintenance medications.  Follow up plan: Return if symptoms worsen or fail to improve.   Continue healthy lifestyle choices, including diet (rich in fruits, vegetables, and lean proteins, and low in salt and simple carbohydrates) and exercise (at  least 30 minutes of moderate physical activity daily).    The above assessment and management plan was discussed with the patient. The patient verbalized understanding of and has agreed to the management plan. Patient is aware to call the clinic if they develop any new symptoms or if symptoms persist or worsen. Patient is aware when to return to the clinic for a follow-up visit. Patient educated on when it is appropriate to go to the emergency department.   Kari Baars, FNP-C [facility] [phone]

## 2023-12-10 NOTE — Patient Instructions (Signed)
Equate Eczema

## 2023-12-17 ENCOUNTER — Encounter (INDEPENDENT_AMBULATORY_CARE_PROVIDER_SITE_OTHER): Payer: Self-pay | Admitting: Family Medicine

## 2023-12-17 ENCOUNTER — Ambulatory Visit (INDEPENDENT_AMBULATORY_CARE_PROVIDER_SITE_OTHER): Payer: 59 | Admitting: Family Medicine

## 2023-12-17 VITALS — BP 119/74 | HR 75 | Temp 97.4°F | Ht 66.0 in | Wt 279.0 lb

## 2023-12-17 DIAGNOSIS — F5089 Other specified eating disorder: Secondary | ICD-10-CM | POA: Diagnosis not present

## 2023-12-17 DIAGNOSIS — E669 Obesity, unspecified: Secondary | ICD-10-CM | POA: Diagnosis not present

## 2023-12-17 DIAGNOSIS — R7303 Prediabetes: Secondary | ICD-10-CM | POA: Diagnosis not present

## 2023-12-17 DIAGNOSIS — Z6841 Body Mass Index (BMI) 40.0 and over, adult: Secondary | ICD-10-CM

## 2023-12-17 DIAGNOSIS — F3289 Other specified depressive episodes: Secondary | ICD-10-CM

## 2023-12-17 MED ORDER — BUPROPION HCL ER (SR) 150 MG PO TB12: 150.0000 mg | ORAL_TABLET | Freq: Every morning | ORAL | 0 refills | Status: DC

## 2023-12-17 NOTE — Progress Notes (Signed)
 .smr  Office: [phone]  /  Fax: [phone]  WEIGHT SUMMARY AND BIOMETRICS  Anthropometric Measurements Height: 5' 6 (1.676 m) Weight: 279 lb (126.6 kg) BMI (Calculated): 45.05 Weight at Last Visit: 282 lb Weight Lost Since Last Visit: 3 lb Weight Gained Since Last Visit: 0 Starting Weight: 288 lb Total Weight Loss (lbs): 9 lb (4.082 kg)   Body Composition  Body Fat %: 40.2 % Fat Mass (lbs): 112.4 lbs Muscle Mass (lbs): 159.2 lbs Total Body Water (lbs): 118 lbs Visceral Fat Rating : 28   Other Clinical Data Fasting: No Labs: No Today's Visit #: 11 Starting Date: 04/16/23    Chief Complaint: OBESITY   History of Present Illness   Brian Steele is a 63 year old male with obesity who presents to discuss weight management.  He has lost three pounds in the last month since his last visit. He is adhering to his category three eating plan 80% of the time and engages in walking for exercise 30 minutes three times per week.  He has shifted his dietary focus towards protein and vegetables, avoiding rice and instant potatoes. He has not purchased butter to resist the temptation of eating bread and uses spray oil and an air fryer for cooking.  He is currently taking metformin  for prediabetes without gastrointestinal issues and bupropion  to manage emotional, comfort, and stress eating. There have been no changes in his medication regimen since the last visit.  He experiences pain in his left knee and is scheduled for knee replacement surgery. He is concerned about potential weight gain before the surgery, which might affect the procedure. He plans to prepare and freeze soup for easy meals post-surgery.          PHYSICAL EXAM:  Blood pressure 119/74, pulse 75, temperature (!) 97.4 F (36.3 C), height 5' 6 (1.676 m), weight 279 lb (126.6 kg), SpO2 97%. Body mass index is 45.03 kg/m.  DIAGNOSTIC DATA REVIEWED:  BMET    Component Value Date/Time   NA 141 11/18/2023  1615   K 4.2 11/18/2023 1615   CL 105 11/18/2023 1615   CO2 24 11/18/2023 1615   GLUCOSE 82 11/18/2023 1615   GLUCOSE 116 (H) 03/30/2009 1255   BUN 13 11/18/2023 1615   CREATININE 1.05 11/18/2023 1615   CALCIUM 9.2 11/18/2023 1615   GFRNONAA 80 06/22/2020 1219   GFRAA 93 06/22/2020 1219   Lab Results  Component Value Date   HGBA1C 5.3 10/31/2023   HGBA1C 6.1 01/18/2021   Lab Results  Component Value Date   INSULIN  29.1 (H) 08/20/2023   INSULIN  12.5 04/16/2023   Lab Results  Component Value Date   TSH 1.600 10/31/2023   CBC    Component Value Date/Time   WBC 9.7 11/18/2023 1615   WBC 9.8 12/11/2009 1446   RBC 4.85 11/18/2023 1615   RBC 5.46 (A) 02/24/2023 0000   HGB 14.0 11/18/2023 1615   HCT 42.1 11/18/2023 1615   PLT 276 11/18/2023 1615   MCV 87 11/18/2023 1615   MCH 28.9 11/18/2023 1615   MCHC 33.3 11/18/2023 1615   MCHC 34.5 12/11/2009 1446   RDW 13.5 11/18/2023 1615   Iron Studies    Component Value Date/Time   IRON 57 10/31/2023 0837   IRON 54 02/25/2023 0000   TIBC 309 10/31/2023 0837   TIBC 405.00 02/25/2023 0000   FERRITIN 138 10/31/2023 0837   FERRITIN 69.30 02/25/2023 0000   IRONPCTSAT 18 10/31/2023 0837   Lipid Panel  Component Value Date/Time   CHOL 154 10/31/2023 0837   TRIG 83 10/31/2023 0837   HDL 52 10/31/2023 0837   CHOLHDL 3.0 10/31/2023 0837   LDLCALC 86 10/31/2023 0837   Hepatic Function Panel     Component Value Date/Time   PROT 6.8 11/18/2023 1615   ALBUMIN 4.2 11/18/2023 1615   AST 18 11/18/2023 1615   ALT 18 11/18/2023 1615   ALKPHOS 100 11/18/2023 1615   BILITOT 0.5 11/18/2023 1615   BILIDIR 0.2 03/31/2009 0500   IBILI 1.1 (H) 03/31/2009 0500      Component Value Date/Time   TSH 1.600 10/31/2023 0837   Nutritional Lab Results  Component Value Date   VD25OH 38.1 11/18/2023   VD25OH 39.5 10/31/2023   VD25OH 59.7 08/20/2023     Assessment and Plan    Obesity Obesity with recent weight loss of three  pounds over the last month. Following a category three eating plan 80% of the time and walking 30 minutes three times per week. Expressed hunger issues and desire for more protein. Introduced a new eating plan emphasizing lean proteins and specific vegetables, requiring strict adherence for effectiveness. Discussed importance of hydration due to dehydration history. - Implement new eating plan with approved foods, emphasizing lean proteins and specific vegetables. (Lower carbohydrate plan) - Adhere strictly to the new eating plan for at least two to four weeks. - Increase water intake to address dehydration. - Follow-up in four weeks.  Prediabetes Prediabetes managed with metformin , well-tolerated without gastrointestinal issues. Focus on diet and exercise to manage weight and improve insulin  sensitivity. - Continue metformin . - Monitor blood glucose levels and dietary adherence.  Emotional Eating Behaviors Emotional eating behaviors managed with bupropion  to address emotional, comfort, and stress eating. - Refill bupropion .  Follow-up - Schedule follow-up appointment in four weeks. - Option to schedule two appointments in March and April for better planning.       He was informed of the importance of frequent follow up visits to maximize his success with intensive lifestyle modifications for his multiple health conditions.    [name], MD

## 2023-12-29 DIAGNOSIS — E7800 Pure hypercholesterolemia, unspecified: Secondary | ICD-10-CM | POA: Diagnosis not present

## 2023-12-29 DIAGNOSIS — Z0000 Encounter for general adult medical examination without abnormal findings: Secondary | ICD-10-CM | POA: Diagnosis not present

## 2024-01-05 DIAGNOSIS — J449 Chronic obstructive pulmonary disease, unspecified: Secondary | ICD-10-CM | POA: Diagnosis not present

## 2024-01-09 ENCOUNTER — Telehealth: Payer: 59 | Admitting: Family Medicine

## 2024-01-09 ENCOUNTER — Ambulatory Visit: Payer: Self-pay | Admitting: Family Medicine

## 2024-01-09 ENCOUNTER — Encounter: Payer: Self-pay | Admitting: Family Medicine

## 2024-01-09 DIAGNOSIS — H6691 Otitis media, unspecified, right ear: Secondary | ICD-10-CM

## 2024-01-09 MED ORDER — AMOXICILLIN-POT CLAVULANATE 875-125 MG PO TABS: 1.0000 | ORAL_TABLET | Freq: Two times a day (BID) | ORAL | 0 refills | Status: DC

## 2024-01-09 NOTE — Progress Notes (Signed)
 Virtual Visit via MyChart video note  I connected with Brian Steele on 01/09/24 at 1341 by video and verified that I am speaking with the correct person using two identifiers. Brian Steele is currently located at home and patient are currently with her during visit. The provider, Elige Radon Philicia Heyne, MD is located in their office at time of visit.  Call ended at 1351  I discussed the limitations, risks, security and privacy concerns of performing an evaluation and management service by video and the availability of in person appointments. I also discussed with the patient that there may be a patient responsible charge related to this service. The patient expressed understanding and agreed to proceed.   History and Present Illness: Patient is calling in for right side neck swelling since yesterday.  The neck hurts on that side of his neck and sore throat with white spots in his throat.  He denies sore throat.  He denies fevers or chills.  He does have pain with swallowing. He has pain in the ear on the right side as well since yesterday.  He denies sick contacts.  He has not taken anything otc yet.  He feels like his whole face and neck are swollen.   1. Right otitis media, unspecified otitis media type     Outpatient Encounter Medications as of 01/09/2024  Medication Sig   amoxicillin-clavulanate (AUGMENTIN) 875-125 MG tablet Take 1 tablet by mouth 2 (two) times daily.   albuterol (VENTOLIN HFA) 108 (90 Base) MCG/ACT inhaler TAKE 2 PUFFS BY MOUTH EVERY 6 HOURS AS NEEDED FOR WHEEZE OR SHORTNESS OF BREATH   aspirin 81 MG EC tablet Take 1 tablet by mouth daily.   Budeson-Glycopyrrol-Formoterol (BREZTRI AEROSPHERE) 160-9-4.8 MCG/ACT AERO Inhale 2 puffs into the lungs 2 (two) times daily.   buPROPion (WELLBUTRIN SR) 150 MG 12 hr tablet Take 1 tablet (150 mg total) by mouth every morning.   fluticasone (FLONASE) 50 MCG/ACT nasal spray Place 2 sprays into both nostrils daily.    HYDROcodone-acetaminophen (NORCO/VICODIN) 5-325 MG tablet Take 1 tablet by mouth 2 (two) times daily.   levocetirizine (XYZAL) 5 MG tablet Take 1 tablet (5 mg total) by mouth every evening.   metFORMIN (GLUCOPHAGE) 500 MG tablet Take 1 tablet (500 mg total) by mouth daily with breakfast.   naloxone (NARCAN) nasal spray 4 mg/0.1 mL Place into the nose.   omeprazole (PRILOSEC) 20 MG capsule Take 1 capsule (20 mg total) by mouth daily. X2-4 weeks   simvastatin (ZOCOR) 40 MG tablet Take 1 tablet (40 mg total) by mouth daily.   No facility-administered encounter medications on file as of 01/09/2024.    Review of Systems  Constitutional:  Negative for chills and fever.  HENT:  Positive for congestion, ear pain, facial swelling and sore throat.   Eyes:  Negative for discharge.  Respiratory:  Negative for shortness of breath and wheezing.   Cardiovascular:  Negative for chest pain and leg swelling.  Musculoskeletal:  Negative for back pain and gait problem.  Skin:  Negative for rash.  All other systems reviewed and are negative.   Observations/Objective: Patient sounds comfortable and in no acute distress  Assessment and Plan: Problem List Items Addressed This Visit   None Visit Diagnoses       Right otitis media, unspecified otitis media type    -  Primary   Relevant Medications   amoxicillin-clavulanate (AUGMENTIN) 875-125 MG tablet     Likely otitis with some lymphadenopathy, will treat  with amoxicillin.  Follow up plan: Return if symptoms worsen or fail to improve.     I discussed the assessment and treatment plan with the patient. The patient was provided an opportunity to ask questions and all were answered. The patient agreed with the plan and demonstrated an understanding of the instructions.   The patient was advised to call back or seek an in-person evaluation if the symptoms worsen or if the condition fails to improve as anticipated.  The above assessment and management  plan was discussed with the patient. The patient verbalized understanding of and has agreed to the management plan. Patient is aware to call the clinic if symptoms persist or worsen. Patient is aware when to return to the clinic for a follow-up visit. Patient educated on when it is appropriate to go to the emergency department.    I provided 10 minutes of non-face-to-face time during this encounter.    Nils Pyle, MD

## 2024-01-09 NOTE — Telephone Encounter (Signed)
  Chief Complaint: face and neck swelling, ear pain right side sore throat per patient wife on DPR Symptoms: right side face neck swelling, ear pain level 3 . Sore throat. Frequency: yesterday  Pertinent Negatives: Patient denies fever no drainage from ear. Pain level low  Disposition: [] ED /[] Urgent Care (no appt availability in office) / [x] Appointment(In office/virtual)/ []  [facility]/ [] Home Care/ [] Refused Recommended Disposition /[] [facility]/ []  Follow-up with PCP Additional Notes:   No available OV today . Offered other provider option and pt wife wanted primary office providers. Called CAL and video VV scheduled today .       Copied from CRM [id number]. Topic: Clinical - Red Word Triage >> Jan 09, 2024  8:13 AM Clayton Bibles wrote: Red Word that prompted transfer to Nurse Triage: Right side of his face and neck are swollen, sore throat, ear hurting. He is severe pain. Reason for Disposition  [1] Mild face swelling (puffiness) AND [2] persists > 3 days    Started yesterday  Answer Assessment - Initial Assessment Questions 1. ONSET: "When did the swelling start?" (e.g., minutes, hours, days)     Yesterday  2. LOCATION: "What part of the face is swollen?"     Right side neck and ear 3. SEVERITY: "How swollen is it?"     na 4. ITCHING: "Is there any itching?" If Yes, ask: "How much?"   (Scale 1-10; mild, moderate or severe)     na 5. PAIN: "Is the swelling painful to touch?" If Yes, ask: "How painful is it?"   (Scale 1-10; mild, moderate or severe)   - NONE (0): no pain   - MILD (1-3): doesn't interfere with normal activities    - MODERATE (4-7): interferes with normal activities or awakens from sleep    - SEVERE (8-10): excruciating pain, unable to do any normal activities      Pain level 3 6. FEVER: "Do you have a fever?" If Yes, ask: "What is it, how was it measured, and when did it start?"      na 7. CAUSE: "What do you think is causing the face  swelling?"     Not sure  8. RECURRENT SYMPTOM: "Have you had face swelling before?" If Yes, ask: "When was the last time?" "What happened that time?"     Na  9. OTHER SYMPTOMS: "Do you have any other symptoms?" (e.g., toothache, leg swelling)     Right ear pain, face swelling, neck swelling sore throat  10. PREGNANCY: "Is there any chance you are pregnant?" "When was your last menstrual period?"       na  Protocols used: Face Swelling-A-AH

## 2024-01-16 ENCOUNTER — Encounter (INDEPENDENT_AMBULATORY_CARE_PROVIDER_SITE_OTHER): Payer: Self-pay | Admitting: Family Medicine

## 2024-01-19 ENCOUNTER — Encounter (INDEPENDENT_AMBULATORY_CARE_PROVIDER_SITE_OTHER): Payer: Self-pay | Admitting: Family Medicine

## 2024-01-19 ENCOUNTER — Ambulatory Visit (INDEPENDENT_AMBULATORY_CARE_PROVIDER_SITE_OTHER): Payer: 59 | Admitting: Family Medicine

## 2024-01-19 VITALS — BP 130/76 | HR 64 | Temp 98.0°F | Ht 66.0 in | Wt 285.0 lb

## 2024-01-19 DIAGNOSIS — Z6841 Body Mass Index (BMI) 40.0 and over, adult: Secondary | ICD-10-CM

## 2024-01-19 DIAGNOSIS — R7303 Prediabetes: Secondary | ICD-10-CM

## 2024-01-19 DIAGNOSIS — E669 Obesity, unspecified: Secondary | ICD-10-CM | POA: Diagnosis not present

## 2024-01-19 DIAGNOSIS — M25512 Pain in left shoulder: Secondary | ICD-10-CM | POA: Diagnosis not present

## 2024-01-19 DIAGNOSIS — G8929 Other chronic pain: Secondary | ICD-10-CM | POA: Diagnosis not present

## 2024-01-19 DIAGNOSIS — F5089 Other specified eating disorder: Secondary | ICD-10-CM | POA: Diagnosis not present

## 2024-01-19 DIAGNOSIS — F3289 Other specified depressive episodes: Secondary | ICD-10-CM

## 2024-01-19 DIAGNOSIS — M961 Postlaminectomy syndrome, not elsewhere classified: Secondary | ICD-10-CM | POA: Diagnosis not present

## 2024-01-19 MED ORDER — BUPROPION HCL ER (SR) 150 MG PO TB12: 150.0000 mg | ORAL_TABLET | Freq: Every morning | ORAL | 0 refills | Status: DC

## 2024-01-19 MED ORDER — METFORMIN HCL 500 MG PO TABS: 500.0000 mg | ORAL_TABLET | Freq: Two times a day (BID) | ORAL | 0 refills | Status: DC

## 2024-01-19 NOTE — Progress Notes (Signed)
 Office: [phone]  /  Fax: [phone]  WEIGHT SUMMARY AND BIOMETRICS  Anthropometric Measurements Height: 5\' 6"  (1.676 m) Weight: 285 lb (129.3 kg) BMI (Calculated): 46.02 Weight at Last Visit: 279 lb Weight Lost Since Last Visit: 0 Weight Gained Since Last Visit: 6 lb Starting Weight: 288 lb Total Weight Loss (lbs): 3 lb (1.361 kg)   Body Composition  Body Fat %: 41 % Fat Mass (lbs): 117 lbs Muscle Mass (lbs): 160.2 lbs Total Body Water (lbs): 126.2 lbs Visceral Fat Rating : 29   Other Clinical Data Fasting: no Labs: no Today's Visit #: 12 Starting Date: 04/16/23    Chief Complaint: OBESITY   History of Present Illness   The patient presents for obesity treatment and progress evaluation. He is accompanied by his caregiver, who is preparing for surgery.  He follows his category three plan about fifty percent of the time and has gained six pounds in the last month since his last visit. He is preparing to start a new diet plan, having removed rice and other items from his home. His caregiver will be undergoing surgery soon, which will require him to take on more household responsibilities, including cooking and additional physical activities like stairlifting and weightlifting.  He is incorporating exercise by walking for thirty minutes two to three times per week. He has recently joined [employer] and plans to use the treadmill and weights.  He has a history of prediabetes and is currently taking metformin 500 mg per day. No gastrointestinal upset with the medication. His current routine involves taking the medication in the morning. He typically wakes up between ten and eleven in the morning and has breakfast around that time, with dinner around four or five in the afternoon. He does not usually eat lunch, which may affect his protein intake and metabolism.  He has a history of emotional eating behaviors and is being treated with Wellbutrin SR 150 mg per day.  He no longer experiences cravings but still struggles with hunger.  He reports feeling 'pretty good' and 'normal' with no recent health issues.          PHYSICAL EXAM:  Blood pressure 130/76, pulse 64, temperature 98 F (36.7 C), height 5\' 6"  (1.676 m), weight 285 lb (129.3 kg), SpO2 94%. Body mass index is 46 kg/m.  DIAGNOSTIC DATA REVIEWED:  BMET    Component Value Date/Time   NA 141 11/18/2023 1615   K 4.2 11/18/2023 1615   CL 105 11/18/2023 1615   CO2 24 11/18/2023 1615   GLUCOSE 82 11/18/2023 1615   GLUCOSE 116 (H) 03/30/2009 1255   BUN 13 11/18/2023 1615   CREATININE 1.05 11/18/2023 1615   CALCIUM 9.2 11/18/2023 1615   GFRNONAA 80 06/22/2020 1219   GFRAA 93 06/22/2020 1219   Lab Results  Component Value Date   HGBA1C 5.3 10/31/2023   HGBA1C 6.1 01/18/2021   Lab Results  Component Value Date   INSULIN 29.1 (H) 08/20/2023   INSULIN 12.5 04/16/2023   Lab Results  Component Value Date   TSH 1.600 10/31/2023   CBC    Component Value Date/Time   WBC 9.7 11/18/2023 1615   WBC 9.8 12/11/2009 1446   RBC 4.85 11/18/2023 1615   RBC 5.46 (A) 02/24/2023 0000   HGB 14.0 11/18/2023 1615   HCT 42.1 11/18/2023 1615   PLT 276 11/18/2023 1615   MCV 87 11/18/2023 1615   MCH 28.9 11/18/2023 1615   MCHC 33.3 11/18/2023 1615   MCHC  34.5 12/11/2009 1446   RDW 13.5 11/18/2023 1615   Iron Studies    Component Value Date/Time   IRON 57 10/31/2023 0837   IRON 54 02/25/2023 0000   TIBC 309 10/31/2023 0837   TIBC 405.00 02/25/2023 0000   FERRITIN 138 10/31/2023 0837   FERRITIN 69.30 02/25/2023 0000   IRONPCTSAT 18 10/31/2023 0837   Lipid Panel     Component Value Date/Time   CHOL 154 10/31/2023 0837   TRIG 83 10/31/2023 0837   HDL 52 10/31/2023 0837   CHOLHDL 3.0 10/31/2023 0837   LDLCALC 86 10/31/2023 0837   Hepatic Function Panel     Component Value Date/Time   PROT 6.8 11/18/2023 1615   ALBUMIN 4.2 11/18/2023 1615   AST 18 11/18/2023 1615   ALT 18  11/18/2023 1615   ALKPHOS 100 11/18/2023 1615   BILITOT 0.5 11/18/2023 1615   BILIDIR 0.2 03/31/2009 0500   IBILI 1.1 (H) 03/31/2009 0500      Component Value Date/Time   TSH 1.600 10/31/2023 0837   Nutritional Lab Results  Component Value Date   VD25OH 38.1 11/18/2023   VD25OH 39.5 10/31/2023   VD25OH 59.7 08/20/2023     Assessment and Plan    Obesity Following category three plan approximately 50% of the time. Gained six pounds in the last month. Walking for exercise 30 minutes two to three times per week. Plans to start the new diet today. Will increase physical activity by attending [employer] while his partner is in physical therapy. Discussed the importance of protein intake to prevent metabolic slowdown and support weight loss. Recommended at least 30 grams of protein per day, potentially using jerky as a protein source. - Start new diet today. - Increase physical activity by attending [employer]. - Ensure intake of at least 30 grams of protein per day, potentially using jerky as a protein source.  Prediabetes Currently on metformin 500 mg per day with no reported gastrointestinal side effects. Reports hunger as a primary issue rather than cravings. Current dosing schedule does not provide full-day coverage. Discussed increasing metformin to 500 mg twice a day to provide better coverage and manage hunger. Explained the timing options for the second dose to optimize coverage throughout the day. - Increase metformin to 500 mg twice a day, taken with breakfast and dinner.  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 No changes in emotional eating behaviors were discussed. - Refill Wellbutrin SR 150 mg per day.  Follow-up - Schedule follow-up appointments in four weeks and another four weeks after that.       He was informed of the importance of frequent follow up visits to maximize his success with intensive lifestyle  modifications for his multiple health conditions.    Quillian Quince, MD

## 2024-01-26 ENCOUNTER — Other Ambulatory Visit: Payer: Self-pay | Admitting: Family Medicine

## 2024-01-26 DIAGNOSIS — J029 Acute pharyngitis, unspecified: Secondary | ICD-10-CM

## 2024-01-26 DIAGNOSIS — J301 Allergic rhinitis due to pollen: Secondary | ICD-10-CM

## 2024-02-13 ENCOUNTER — Other Ambulatory Visit (INDEPENDENT_AMBULATORY_CARE_PROVIDER_SITE_OTHER): Payer: Self-pay | Admitting: Family Medicine

## 2024-02-13 DIAGNOSIS — R7303 Prediabetes: Secondary | ICD-10-CM

## 2024-02-16 ENCOUNTER — Encounter (INDEPENDENT_AMBULATORY_CARE_PROVIDER_SITE_OTHER): Payer: Self-pay | Admitting: Family Medicine

## 2024-02-16 ENCOUNTER — Ambulatory Visit (INDEPENDENT_AMBULATORY_CARE_PROVIDER_SITE_OTHER): Payer: Medicare PPO | Admitting: Family Medicine

## 2024-02-16 VITALS — BP 135/74 | HR 83 | Temp 98.5°F | Ht 66.0 in | Wt 281.0 lb

## 2024-02-16 DIAGNOSIS — F3289 Other specified depressive episodes: Secondary | ICD-10-CM

## 2024-02-16 DIAGNOSIS — R7303 Prediabetes: Secondary | ICD-10-CM

## 2024-02-16 DIAGNOSIS — E669 Obesity, unspecified: Secondary | ICD-10-CM | POA: Diagnosis not present

## 2024-02-16 DIAGNOSIS — K2100 Gastro-esophageal reflux disease with esophagitis, without bleeding: Secondary | ICD-10-CM | POA: Diagnosis not present

## 2024-02-16 DIAGNOSIS — F5089 Other specified eating disorder: Secondary | ICD-10-CM

## 2024-02-16 DIAGNOSIS — Z6841 Body Mass Index (BMI) 40.0 and over, adult: Secondary | ICD-10-CM

## 2024-02-16 MED ORDER — BUPROPION HCL ER (SR) 150 MG PO TB12
150.0000 mg | ORAL_TABLET | Freq: Every morning | ORAL | 0 refills | Status: DC
Start: 2024-02-16 — End: 2024-03-15

## 2024-02-16 NOTE — Progress Notes (Signed)
 Office: [phone]  /  Fax: [phone]  WEIGHT SUMMARY AND BIOMETRICS  Anthropometric Measurements Height: 5\' 6"  (1.676 m) Weight: 281 lb (127.5 kg) BMI (Calculated): 45.38 Weight at Last Visit: 285 lb Weight Lost Since Last Visit: 4 Weight Gained Since Last Visit: 0 Starting Weight: 288 lb Total Weight Loss (lbs): 7 lb (3.175 kg)   Body Composition  Body Fat %: 40.3 % Fat Mass (lbs): 113.4 lbs Muscle Mass (lbs): 159.8 lbs Total Body Water (lbs): 122.2 lbs Visceral Fat Rating : 29   Other Clinical Data Fasting: no Labs: no Today's Visit #: 13 Starting Date: 04/16/23    Chief Complaint: OBESITY  History of Present Illness Brian Steele is a 63 year old male who presents to discuss his weight management plan and assess progress.  He is following his category three eating plan about sixty percent of the time and has increased physical activity by engaging in yard work for about sixty minutes two times per week. He has lost four pounds since his last visit one month ago.  He has a history of emotional eating behaviors, which is being managed with Wellbutrin. He reports stability in this area and requests a refill of his medication.  He has not been taking omeprazole for heartburn and does not report current heartburn issues. He recalls being prescribed Nexium after neck surgery in the past but is not currently using any proton pump inhibitors.  He is currently taking metformin, though he is unsure of the exact dosing schedule, believing it to be twice a day. He states he still has a sufficient supply of the medication.      PHYSICAL EXAM:  Blood pressure 135/74, pulse 83, temperature 98.5 F (36.9 C), height 5\' 6"  (1.676 m), weight 281 lb (127.5 kg), SpO2 99%. Body mass index is 45.35 kg/m.  DIAGNOSTIC DATA REVIEWED:  BMET    Component Value Date/Time   NA 141 11/18/2023 1615   K 4.2 11/18/2023 1615   CL 105 11/18/2023 1615   CO2 24 11/18/2023 1615    GLUCOSE 82 11/18/2023 1615   GLUCOSE 116 (H) 03/30/2009 1255   BUN 13 11/18/2023 1615   CREATININE 1.05 11/18/2023 1615   CALCIUM 9.2 11/18/2023 1615   GFRNONAA 80 06/22/2020 1219   GFRAA 93 06/22/2020 1219   Lab Results  Component Value Date   HGBA1C 5.3 10/31/2023   HGBA1C 6.1 01/18/2021   Lab Results  Component Value Date   INSULIN 29.1 (H) 08/20/2023   INSULIN 12.5 04/16/2023   Lab Results  Component Value Date   TSH 1.600 10/31/2023   CBC    Component Value Date/Time   WBC 9.7 11/18/2023 1615   WBC 9.8 12/11/2009 1446   RBC 4.85 11/18/2023 1615   RBC 5.46 (A) 02/24/2023 0000   HGB 14.0 11/18/2023 1615   HCT 42.1 11/18/2023 1615   PLT 276 11/18/2023 1615   MCV 87 11/18/2023 1615   MCH 28.9 11/18/2023 1615   MCHC 33.3 11/18/2023 1615   MCHC 34.5 12/11/2009 1446   RDW 13.5 11/18/2023 1615   Iron Studies    Component Value Date/Time   IRON 57 10/31/2023 0837   IRON 54 02/25/2023 0000   TIBC 309 10/31/2023 0837   TIBC 405.00 02/25/2023 0000   FERRITIN 138 10/31/2023 0837   FERRITIN 69.30 02/25/2023 0000   IRONPCTSAT 18 10/31/2023 0837   Lipid Panel     Component Value Date/Time   CHOL 154 10/31/2023 0837   TRIG 83  10/31/2023 0837   HDL 52 10/31/2023 0837   CHOLHDL 3.0 10/31/2023 0837   LDLCALC 86 10/31/2023 0837   Hepatic Function Panel     Component Value Date/Time   PROT 6.8 11/18/2023 1615   ALBUMIN 4.2 11/18/2023 1615   AST 18 11/18/2023 1615   ALT 18 11/18/2023 1615   ALKPHOS 100 11/18/2023 1615   BILITOT 0.5 11/18/2023 1615   BILIDIR 0.2 03/31/2009 0500   IBILI 1.1 (H) 03/31/2009 0500      Component Value Date/Time   TSH 1.600 10/31/2023 0837   Nutritional Lab Results  Component Value Date   VD25OH 38.1 11/18/2023   VD25OH 39.5 10/31/2023   VD25OH 59.7 08/20/2023     Assessment and Plan Assessment & Plan Obesity and Emotional Eating Behaviors He adheres to a category three eating plan approximately 60% of the time and engages  in physical activity, such as yard work, for 60 minutes twice weekly. He has lost four pounds since the last visit one month ago. Emotional eating behaviors are managed with Wellbutrin. Despite disruptions due to his wife's surgery and multiple birthdays, he effectively controls portions. - Continue category three eating plan - Encourage increased physical activity - Refill Wellbutrin for 90 days  Prediabetes He manages prediabetes with lifestyle modifications, including diet and exercise, and is currently on metformin with a sufficient supply. He is aware of the dosing schedule and advised to continue these efforts. - Continue metformin twice a day - Continue diet and exercise for prediabetes management  Gastroesophageal Reflux Disease (GERD) He reports no current heartburn and has not been taking omeprazole. Previously prescribed Nexium post neck surgery. Advised to avoid spicy foods, which is challenging due to his [place]. Weight loss is expected to improve GERD symptoms. - Discontinue omeprazole - Continue weight loss efforts to improve GERD symptoms    He was informed of the importance of frequent follow up visits to maximize his success with intensive lifestyle modifications for his multiple health conditions.    Quillian Quince, MD

## 2024-02-20 ENCOUNTER — Ambulatory Visit: Payer: Medicare PPO

## 2024-02-20 DIAGNOSIS — Z0000 Encounter for general adult medical examination without abnormal findings: Secondary | ICD-10-CM

## 2024-02-20 NOTE — Progress Notes (Signed)
 Subjective:   Brian Steele is a 63 y.o. who presents for a Medicare Wellness preventive visit.  Visit Complete: Virtual I connected with  [name] on 02/20/24 by a audio enabled telemedicine application and verified that I am speaking with the correct person using two identifiers.  Patient Location: Home  Provider Location: Office/Clinic  I discussed the limitations of evaluation and management by telemedicine. The patient expressed understanding and agreed to proceed.  Vital Signs: Because this visit was a virtual/telehealth visit, some criteria may be missing or patient reported. Any vitals not documented were not able to be obtained and vitals that have been documented are patient reported.  VideoError- Librarian, academic were attempted between this provider and patient, however failed, due to patient having technical difficulties OR patient did not have access to video capability.  We continued and completed visit with audio only.   Persons Participating in Visit: Patient.  AWV Questionnaire: Yes: Patient Medicare AWV questionnaire was completed by the patient on 02/16/2024; I have confirmed that all information answered by patient is correct and no changes since this date.  Cardiac Risk Factors include: advanced age (>16men, >53 women);male gender     Objective:    Today's Vitals   02/20/24 1006  PainSc: 6    There is no height or weight on file to calculate BMI.     02/20/2024   10:10 AM 02/19/2023    9:55 AM  Advanced Directives  Does Patient Have a Medical Advance Directive? No No  Would patient like information on creating a medical advance directive? No - Patient declined No - Patient declined    Current Medications (verified) Outpatient Encounter Medications as of 02/20/2024  Medication Sig   albuterol (VENTOLIN HFA) 108 (90 Base) MCG/ACT inhaler TAKE 2 PUFFS BY MOUTH EVERY 6 HOURS AS NEEDED FOR WHEEZE OR SHORTNESS OF BREATH    aspirin 81 MG EC tablet Take 1 tablet by mouth daily.   Budeson-Glycopyrrol-Formoterol (BREZTRI AEROSPHERE) 160-9-4.8 MCG/ACT AERO Inhale 2 puffs into the lungs 2 (two) times daily.   buPROPion (WELLBUTRIN SR) 150 MG 12 hr tablet Take 1 tablet (150 mg total) by mouth every morning.   fluticasone (FLONASE) 50 MCG/ACT nasal spray Place 2 sprays into both nostrils daily.   HYDROcodone-acetaminophen (NORCO/VICODIN) 5-325 MG tablet Take 1 tablet by mouth 2 (two) times daily.   levocetirizine (XYZAL) 5 MG tablet TAKE 1 TABLET BY MOUTH EVERY DAY IN THE EVENING   metFORMIN (GLUCOPHAGE) 500 MG tablet Take 1 tablet (500 mg total) by mouth 2 (two) times daily with a meal.   naloxone (NARCAN) nasal spray 4 mg/0.1 mL Place into the nose.   omeprazole (PRILOSEC) 20 MG capsule TAKE 1 CAPSULE BY MOUTH EVERY DAY   simvastatin (ZOCOR) 40 MG tablet Take 1 tablet (40 mg total) by mouth daily.   No facility-administered encounter medications on file as of 02/20/2024.    Allergies (verified) Bee venom, Other, Sulfa antibiotics, and Sodium hypochlorite   History: Past Medical History:  Diagnosis Date   Allergy    Aortic atherosclerosis (HCC) 01/12/2022   Arthritis    Back pain    Barrett's esophagus    Bilateral swelling of feet    Chest pain    COPD (chronic obstructive pulmonary disease) (HCC)    Gallbladder problem    GERD (gastroesophageal reflux disease)    Hyperlipidemia    Obstructive sleep apnea    Prediabetes    Sleep apnea  SOB (shortness of breath)    Vitamin D deficiency    Past Surgical History:  Procedure Laterality Date   APPENDECTOMY     CARPAL TUNNEL RELEASE     CHOLECYSTECTOMY  2013   KNEE ARTHROSCOPY     meniscal repair   NECK SURGERY     has plates   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   SHOULDER SURGERY Bilateral    Family History  Problem Relation Age of Onset   Arthritis/Rheumatoid Mother    Cancer Mother    Diabetes Mother    Cancer Father    Diabetes  Father    Kidney cancer Father    Obesity Father    Hypertension Brother    Hyperlipidemia Brother    Obesity Daughter    Social History   Socioeconomic History   Marital status: Married    Spouse name: Not on file   Number of children: Not on file   Years of education: Not on file   Highest education level: Not on file  Occupational History   Occupation: Retired  Tobacco Use   Smoking status: Former    Current packs/day: 0.00    Average packs/day: 2.0 packs/day for 26.0 years (52.0 ttl pk-yrs)    Types: Cigarettes    Start date: 66    Quit date: 2018    Years since quitting: 7.2   Smokeless tobacco: Never  Vaping Use   Vaping status: Never Used  Substance and Sexual Activity   Alcohol use: Not Currently   Drug use: Never   Sexual activity: Not Currently    Birth control/protection: None  Other Topics Concern   Not on file  Social History Narrative   ** Merged History Encounter **       Social Drivers of Health   Financial Resource Strain: Low Risk  (02/19/2024)   Overall Financial Resource Strain (CARDIA)    Difficulty of Paying Living Expenses: Not hard at all  Food Insecurity: No Food Insecurity (02/19/2024)   Hunger Vital Sign    Worried About Running Out of Food in the Last Year: Never true    Ran Out of Food in the Last Year: Never true  Transportation Needs: No Transportation Needs (02/19/2024)   PRAPARE - Administrator, Civil Service (Medical): No    Lack of Transportation (Non-Medical): No  Physical Activity: Insufficiently Active (02/19/2024)   Exercise Vital Sign    Days of Exercise per Week: 3 days    Minutes of Exercise per Session: 30 min  Stress: No Stress Concern Present (02/19/2024)   [employer] of Occupational Health - Occupational Stress Questionnaire    Feeling of Stress : Not at all  Social Connections: Unknown (02/19/2024)   Social Connection and Isolation Panel [NHANES]    Frequency of Communication with Friends and  Family: More than three times a week    Frequency of Social Gatherings with Friends and Family: Once a week    Attends Religious Services: Patient declined    Database administrator or Organizations: No    Attends Engineer, structural: Not on file    Marital Status: Married    Tobacco Counseling Counseling given: Not Answered    Clinical Intake:  Pre-visit preparation completed: Yes  Pain : 0-10 Pain Score: 6  Pain Type: Chronic pain Pain Location: Shoulder Pain Orientation: Left Pain Descriptors / Indicators: Aching Pain Onset: More than a month ago Pain Frequency: Constant     Nutritional Risks: None  Diabetes: No  Lab Results  Component Value Date   HGBA1C 5.3 10/31/2023   HGBA1C 5.8 (H) 08/20/2023   HGBA1C 5.5 02/13/2023     How often do you need to have someone help you when you read instructions, pamphlets, or other written materials from your doctor or pharmacy?: 1 - Never  Interpreter Needed?: No  Information entered by :: NAllen LPN   Activities of Daily Living     02/16/2024    8:44 AM  In your present state of health, do you have any difficulty performing the following activities:  Hearing? 0  Vision? 0  Difficulty concentrating or making decisions? 0  Walking or climbing stairs? 0  Dressing or bathing? 0  Doing errands, shopping? 0  Preparing Food and eating ? N  Using the Toilet? N  In the past six months, have you accidently leaked urine? N  Do you have problems with loss of bowel control? N  Managing your Medications? N  Managing your Finances? N  Housekeeping or managing your Housekeeping? N    Patient Care Team: Sonny Masters, FNP as PCP - General (Family Medicine) Kizzie Bane as Physician Assistant (Physician Assistant) Verlin Dike., MD as Referring Physician (Sports Medicine)  Indicate any recent Medical Services you may have received from other than [facility] providers in the past year (date may be  approximate).     Assessment:   This is a routine wellness examination for [name].  Hearing/Vision screen Hearing Screening - Comments:: Denies hearing issues Vision Screening - Comments:: No regular eye exams   Goals Addressed             This Visit's Progress    Patient Stated       02/20/2024, wants to lose weight       Depression Screen     02/20/2024   10:11 AM 12/10/2023   12:19 PM 11/18/2023    3:35 PM 10/31/2023    8:27 AM 10/15/2023   11:08 AM 08/26/2023    2:49 PM 03/20/2023    9:36 AM  PHQ 2/9 Scores  PHQ - 2 Score 0 0 0 0 0 0 0  PHQ- 9 Score  0 0 0 0 0 1    Fall Risk     02/16/2024    8:44 AM 10/31/2023    8:27 AM 10/15/2023   11:08 AM 08/26/2023    2:49 PM 03/20/2023    9:36 AM  Fall Risk   Falls in the past year? 0 0 0 0 0  Number falls in past yr:   0 0   Injury with Fall? 0  0 0   Risk for fall due to : Medication side effect No Fall Risks No Fall Risks No Fall Risks   Follow up Falls prevention discussed;Falls evaluation completed Falls evaluation completed Falls evaluation completed      MEDICARE RISK AT HOME:  Medicare Risk at Home Any stairs in or around the home?: (Patient-Rptd) Yes If so, are there any without handrails?: (Patient-Rptd) No Home free of loose throw rugs in walkways, pet beds, electrical cords, etc?: (Patient-Rptd) No Adequate lighting in your home to reduce risk of falls?: (Patient-Rptd) Yes Life alert?: (Patient-Rptd) No Use of a cane, walker or w/c?: (Patient-Rptd) No Grab bars in the bathroom?: (Patient-Rptd) No Shower chair or bench in shower?: (Patient-Rptd) No Elevated toilet seat or a handicapped toilet?: (Patient-Rptd) No  TIMED UP AND GO:  Was the test performed?  No  Cognitive  Function: 6CIT completed        02/20/2024   10:11 AM 02/19/2023    9:56 AM  6CIT Screen  What Year? 0 points 0 points  What month? 0 points 0 points  What time? 0 points 0 points  Count back from 20 0 points 0 points  Months in  reverse 0 points 0 points  Repeat phrase 0 points 0 points  Total Score 0 points 0 points    Immunizations Immunization History  Administered Date(s) Administered   Influenza, Seasonal, Injecte, Preservative Fre 09/11/2023   Influenza,inj,Quad PF,6+ Mos 08/03/2015   Influenza,inj,quad, With Preservative 09/21/2014   Influenza-Unspecified 09/21/2014   Moderna Sars-Covid-2 Vaccination 03/10/2020, 04/07/2020, 11/06/2020   Pneumococcal Conjugate-13 10/11/2015    Screening Tests Health Maintenance  Topic Date Due   Zoster Vaccines- Shingrix (1 of 2) Never done   COVID-19 Vaccine (4 - 2024-25 season) 07/13/2023   Pneumococcal Vaccine 49-15 Years old (2 of 2 - PPSV23) 12/09/2024 (Originally 12/06/2015)   Lung Cancer Screening  06/11/2024   INFLUENZA VACCINE  06/11/2024   Medicare Annual Wellness (AWV)  02/19/2025   Colonoscopy  02/08/2026   Hepatitis C Screening  Completed   HPV VACCINES  Aged Out   Meningococcal B Vaccine  Aged Out   DTaP/Tdap/Td  Discontinued   HIV Screening  Discontinued    Health Maintenance  Health Maintenance Due  Topic Date Due   Zoster Vaccines- Shingrix (1 of 2) Never done   COVID-19 Vaccine (4 - 2024-25 season) 07/13/2023   Health Maintenance Items Addressed: Due for shingrix and covid vaccine.  Additional Screening:  Vision Screening: Recommended annual ophthalmology exams for early detection of glaucoma and other disorders of the eye.  Dental Screening: Recommended annual dental exams for proper oral hygiene  Community Resource Referral / Chronic Care Management: CRR required this visit?  No   CCM required this visit?  No     Plan:     I have personally reviewed and noted the following in the patient's chart:   Medical and social history Use of alcohol, tobacco or illicit drugs  Current medications and supplements including opioid prescriptions. Patient is currently taking opioid prescriptions. Information provided to patient  regarding non-opioid alternatives. Patient advised to discuss non-opioid treatment plan with their provider. Functional ability and status Nutritional status Physical activity Advanced directives List of other physicians Hospitalizations, surgeries, and ER visits in previous 12 months Vitals Screenings to include cognitive, depression, and falls Referrals and appointments  In addition, I have reviewed and discussed with patient certain preventive protocols, quality metrics, and best practice recommendations. A written personalized care plan for preventive services as well as general preventive health recommendations were provided to patient.     Barb Merino, LPN   02/17/8118   After Visit Summary: (MyChart) Due to this being a telephonic visit, the after visit summary with patients personalized plan was offered to patient via MyChart   Notes: Nothing significant to report at this time.

## 2024-02-20 NOTE — Patient Instructions (Signed)
 Mr. Yearby , Thank you for taking time to come for your Medicare Wellness Visit. I appreciate your ongoing commitment to your health goals. Please review the following plan we discussed and let me know if I can assist you in the future.   Referrals/Orders/Follow-Ups/Clinician Recommendations: none  This is a list of the screening recommended for you and due dates:  Health Maintenance  Topic Date Due   Zoster (Shingles) Vaccine (1 of 2) Never done   COVID-19 Vaccine (4 - 2024-25 season) 07/13/2023   Pneumococcal Vaccination (2 of 2 - PPSV23) 12/09/2024*   Screening for Lung Cancer  06/11/2024   Flu Shot  06/11/2024   Medicare Annual Wellness Visit  02/19/2025   Colon Cancer Screening  02/08/2026   Hepatitis C Screening  Completed   HPV Vaccine  Aged Out   Meningitis B Vaccine  Aged Out   DTaP/Tdap/Td vaccine  Discontinued   HIV Screening  Discontinued  *Topic was postponed. The date shown is not the original due date.    Advanced directives: (ACP Link)Information on Advanced Care Planning can be found at [facility] of [place] Advance Health Care Directives Advance Health Care Directives. [email or web address]   Next Medicare Annual Wellness Visit scheduled for next year: Yes  insert Preventive Care attachment Insert FALL PREVENTION attachment if needed

## 2024-02-28 ENCOUNTER — Other Ambulatory Visit (INDEPENDENT_AMBULATORY_CARE_PROVIDER_SITE_OTHER): Payer: Self-pay | Admitting: Family Medicine

## 2024-02-28 DIAGNOSIS — R7303 Prediabetes: Secondary | ICD-10-CM

## 2024-03-01 ENCOUNTER — Other Ambulatory Visit (INDEPENDENT_AMBULATORY_CARE_PROVIDER_SITE_OTHER): Payer: Self-pay | Admitting: Family Medicine

## 2024-03-01 DIAGNOSIS — R7303 Prediabetes: Secondary | ICD-10-CM

## 2024-03-15 ENCOUNTER — Encounter (INDEPENDENT_AMBULATORY_CARE_PROVIDER_SITE_OTHER): Payer: Self-pay | Admitting: Family Medicine

## 2024-03-15 ENCOUNTER — Ambulatory Visit (INDEPENDENT_AMBULATORY_CARE_PROVIDER_SITE_OTHER): Admitting: Family Medicine

## 2024-03-15 ENCOUNTER — Other Ambulatory Visit (INDEPENDENT_AMBULATORY_CARE_PROVIDER_SITE_OTHER): Payer: Self-pay | Admitting: Family Medicine

## 2024-03-15 VITALS — BP 122/71 | HR 74 | Temp 98.1°F | Ht 66.0 in | Wt 282.0 lb

## 2024-03-15 DIAGNOSIS — F5089 Other specified eating disorder: Secondary | ICD-10-CM

## 2024-03-15 DIAGNOSIS — F3289 Other specified depressive episodes: Secondary | ICD-10-CM

## 2024-03-15 DIAGNOSIS — R21 Rash and other nonspecific skin eruption: Secondary | ICD-10-CM | POA: Diagnosis not present

## 2024-03-15 DIAGNOSIS — Z6841 Body Mass Index (BMI) 40.0 and over, adult: Secondary | ICD-10-CM

## 2024-03-15 DIAGNOSIS — E669 Obesity, unspecified: Secondary | ICD-10-CM | POA: Diagnosis not present

## 2024-03-15 DIAGNOSIS — R7303 Prediabetes: Secondary | ICD-10-CM | POA: Diagnosis not present

## 2024-03-15 MED ORDER — BUPROPION HCL ER (SR) 150 MG PO TB12: 150.0000 mg | ORAL_TABLET | Freq: Every morning | ORAL | 0 refills | Status: DC

## 2024-03-15 MED ORDER — METFORMIN HCL 500 MG PO TABS: 500.0000 mg | ORAL_TABLET | Freq: Two times a day (BID) | ORAL | 0 refills | Status: DC

## 2024-03-15 NOTE — Progress Notes (Signed)
 Office: [phone]  /  Fax: [phone]  WEIGHT SUMMARY AND BIOMETRICS  Anthropometric Measurements Height: 5\' 6"  (1.676 m) Weight: 282 lb (127.9 kg) BMI (Calculated): 45.54 Weight at Last Visit: 281 lb Weight Lost Since Last Visit: 0 Weight Gained Since Last Visit: 1 lb Starting Weight: 288 lb Total Weight Loss (lbs): 6 lb (2.722 kg)   Body Composition  Body Fat %: 40.3 % Fat Mass (lbs): 113.8 lbs Muscle Mass (lbs): 160.4 lbs Total Body Water (lbs): 124.2 lbs Visceral Fat Rating : 29   Other Clinical Data Fasting: No Labs: No Today's Visit #: 14 Starting Date: 04/16/23    Chief Complaint: OBESITY   History of Present Illness Brian Steele is a 63 year old male with obesity and prediabetes who presents for obesity treatment and progress assessment.  He is adhering to his prescribed category three eating plan approximately seventy percent of the time but has gained one pound in the last month. He engages in physical activities such as walking up and down stairs and doing yard work, although he has not done recent yard work. He finds it challenging to consistently consume enough protein and often resorts to eating sugary cereals for convenience. He has eliminated potatoes and rice from his diet. He is currently taking metformin  for prediabetes without any gastrointestinal issues and is due for a refill.  He has a rash on his lower extremities that sometimes itches and oozes, causing the skin to stick to his socks. The sores started as small lesions and his skin oozes some clear liquid. He has 1+ B pitting edema today. He does not use moisturizer on his skin, which may contribute to the dryness and cracking. He has not informed his primary care doctor about the rash.  He is taking Wellbutrin  to help manage emotional eating behaviors. He might run out of Wellbutrin  soon and needs a refill. He acknowledges that he is not eating a lot but needs to focus on consuming the  right foods.      PHYSICAL EXAM:  Blood pressure 122/71, pulse 74, temperature 98.1 F (36.7 C), height 5\' 6"  (1.676 m), weight 282 lb (127.9 kg), SpO2 98%. Body mass index is 45.52 kg/m.  DIAGNOSTIC DATA REVIEWED:  BMET    Component Value Date/Time   NA 141 11/18/2023 1615   K 4.2 11/18/2023 1615   CL 105 11/18/2023 1615   CO2 24 11/18/2023 1615   GLUCOSE 82 11/18/2023 1615   GLUCOSE 116 (H) 03/30/2009 1255   BUN 13 11/18/2023 1615   CREATININE 1.05 11/18/2023 1615   CALCIUM 9.2 11/18/2023 1615   GFRNONAA 80 06/22/2020 1219   GFRAA 93 06/22/2020 1219   Lab Results  Component Value Date   HGBA1C 5.3 10/31/2023   HGBA1C 6.1 01/18/2021   Lab Results  Component Value Date   INSULIN  29.1 (H) 08/20/2023   INSULIN  12.5 04/16/2023   Lab Results  Component Value Date   TSH 1.600 10/31/2023   CBC    Component Value Date/Time   WBC 9.7 11/18/2023 1615   WBC 9.8 12/11/2009 1446   RBC 4.85 11/18/2023 1615   RBC 5.46 (A) 02/24/2023 0000   HGB 14.0 11/18/2023 1615   HCT 42.1 11/18/2023 1615   PLT 276 11/18/2023 1615   MCV 87 11/18/2023 1615   MCH 28.9 11/18/2023 1615   MCHC 33.3 11/18/2023 1615   MCHC 34.5 12/11/2009 1446   RDW 13.5 11/18/2023 1615   Iron Studies    Component Value  Date/Time   IRON 57 10/31/2023 0837   IRON 54 02/25/2023 0000   TIBC 309 10/31/2023 0837   TIBC 405.00 02/25/2023 0000   FERRITIN 138 10/31/2023 0837   FERRITIN 69.30 02/25/2023 0000   IRONPCTSAT 18 10/31/2023 0837   Lipid Panel     Component Value Date/Time   CHOL 154 10/31/2023 0837   TRIG 83 10/31/2023 0837   HDL 52 10/31/2023 0837   CHOLHDL 3.0 10/31/2023 0837   LDLCALC 86 10/31/2023 0837   Hepatic Function Panel     Component Value Date/Time   PROT 6.8 11/18/2023 1615   ALBUMIN 4.2 11/18/2023 1615   AST 18 11/18/2023 1615   ALT 18 11/18/2023 1615   ALKPHOS 100 11/18/2023 1615   BILITOT 0.5 11/18/2023 1615   BILIDIR 0.2 03/31/2009 0500   IBILI 1.1 (H) 03/31/2009  0500      Component Value Date/Time   TSH 1.600 10/31/2023 0837   Nutritional Lab Results  Component Value Date   VD25OH 38.1 11/18/2023   VD25OH 39.5 10/31/2023   VD25OH 59.7 08/20/2023     Assessment and Plan Assessment & Plan Lower extremity rash He has sores on his legs that ooze and adhere to his socks, causing skin tears upon removal. The sores have decreased to one-fourth of his original size. Fluid retention in the legs may cause microscopic skin cracks, increasing infection risk. He does not use moisturizer, which could prevent skin cracking. There is a risk of progression to sepsis if not managed properly. - Refer to primary care for further evaluation of leg sores. - Recommend using non-stick gauze to cover sores before wearing socks. - Advise using moisturizer to prevent skin cracking but not while he has open sores.  Obesity He adheres to the category three eating plan approximately seventy percent of the time and has gained one pound in the last month. He performs yard work inconsistently. He struggles with maintaining protein intake and has reverted to sugary cereals for convenience. He does not experience significant hunger but needs to focus on protein intake to support metabolism and weight management. - Adhere more closely to category three eating plan. - Increase exercise as tolerated. - Encourage protein intake over sugary cereals.  Emotional eating behaviors He is on Wellbutrin  (bupropion ) to manage emotional, stress, and comfort eating. He reports low food intake but needs to focus on consuming appropriate foods. - Refill Wellbutrin  prescription.  Prediabetes He is treated with metformin , diet, exercise, and weight loss. He reports no gastrointestinal issues with metformin . - Refill metformin  prescription. - Continue working on diet and exercise to prevent progressing to DMII.   He was informed of the importance of frequent follow up visits to maximize his  success with intensive lifestyle modifications for his multiple health conditions.    Jasmine Mesi, MD

## 2024-03-17 DIAGNOSIS — M25512 Pain in left shoulder: Secondary | ICD-10-CM | POA: Diagnosis not present

## 2024-03-17 DIAGNOSIS — G8929 Other chronic pain: Secondary | ICD-10-CM | POA: Diagnosis not present

## 2024-03-17 DIAGNOSIS — Z79899 Other long term (current) drug therapy: Secondary | ICD-10-CM | POA: Diagnosis not present

## 2024-03-17 DIAGNOSIS — M7918 Myalgia, other site: Secondary | ICD-10-CM | POA: Diagnosis not present

## 2024-03-31 ENCOUNTER — Ambulatory Visit (INDEPENDENT_AMBULATORY_CARE_PROVIDER_SITE_OTHER): Admitting: Family Medicine

## 2024-04-07 DIAGNOSIS — Z79899 Other long term (current) drug therapy: Secondary | ICD-10-CM | POA: Diagnosis not present

## 2024-04-14 ENCOUNTER — Other Ambulatory Visit (INDEPENDENT_AMBULATORY_CARE_PROVIDER_SITE_OTHER): Payer: Self-pay | Admitting: Family Medicine

## 2024-04-14 DIAGNOSIS — R7303 Prediabetes: Secondary | ICD-10-CM

## 2024-04-20 DIAGNOSIS — M25512 Pain in left shoulder: Secondary | ICD-10-CM | POA: Diagnosis not present

## 2024-04-20 DIAGNOSIS — M961 Postlaminectomy syndrome, not elsewhere classified: Secondary | ICD-10-CM | POA: Diagnosis not present

## 2024-04-20 DIAGNOSIS — G894 Chronic pain syndrome: Secondary | ICD-10-CM | POA: Diagnosis not present

## 2024-05-04 ENCOUNTER — Ambulatory Visit (INDEPENDENT_AMBULATORY_CARE_PROVIDER_SITE_OTHER): Admitting: Family Medicine

## 2024-05-04 ENCOUNTER — Encounter (INDEPENDENT_AMBULATORY_CARE_PROVIDER_SITE_OTHER): Payer: Self-pay | Admitting: Family Medicine

## 2024-05-04 VITALS — BP 121/63 | HR 75 | Temp 97.8°F | Ht 66.0 in | Wt 275.0 lb

## 2024-05-04 DIAGNOSIS — F5089 Other specified eating disorder: Secondary | ICD-10-CM

## 2024-05-04 DIAGNOSIS — I10 Essential (primary) hypertension: Secondary | ICD-10-CM | POA: Diagnosis not present

## 2024-05-04 DIAGNOSIS — E669 Obesity, unspecified: Secondary | ICD-10-CM

## 2024-05-04 DIAGNOSIS — R0602 Shortness of breath: Secondary | ICD-10-CM | POA: Diagnosis not present

## 2024-05-04 DIAGNOSIS — Z6841 Body Mass Index (BMI) 40.0 and over, adult: Secondary | ICD-10-CM

## 2024-05-04 DIAGNOSIS — R7303 Prediabetes: Secondary | ICD-10-CM | POA: Diagnosis not present

## 2024-05-04 DIAGNOSIS — J449 Chronic obstructive pulmonary disease, unspecified: Secondary | ICD-10-CM | POA: Diagnosis not present

## 2024-05-04 DIAGNOSIS — F3289 Other specified depressive episodes: Secondary | ICD-10-CM

## 2024-05-04 DIAGNOSIS — Z9109 Other allergy status, other than to drugs and biological substances: Secondary | ICD-10-CM | POA: Diagnosis not present

## 2024-05-04 MED ORDER — BUPROPION HCL ER (SR) 150 MG PO TB12
150.0000 mg | ORAL_TABLET | Freq: Every morning | ORAL | 0 refills | Status: DC
Start: 2024-05-04 — End: 2024-07-14

## 2024-05-04 MED ORDER — METFORMIN HCL 500 MG PO TABS: 500.0000 mg | ORAL_TABLET | Freq: Two times a day (BID) | ORAL | 0 refills | Status: DC

## 2024-05-04 NOTE — Progress Notes (Signed)
 Office: [phone]  /  Fax: [phone]  WEIGHT SUMMARY AND BIOMETRICS  Anthropometric Measurements Height: 5' 6 (1.676 m) Weight: 275 lb (124.7 kg) BMI (Calculated): 44.41 Weight at Last Visit: 282 lb Weight Lost Since Last Visit: 7 lb Weight Gained Since Last Visit: 0 Starting Weight: 288 lb Total Weight Loss (lbs): 13 lb (5.897 kg)   Body Composition  Body Fat %: 39.6 % Fat Mass (lbs): 109.2 lbs Muscle Mass (lbs): 158.2 lbs Total Body Water (lbs): 124.2 lbs Visceral Fat Rating : 28   Other Clinical Data Fasting: No Labs: No Today's Visit #: 15 Starting Date: 04/16/23    Chief Complaint: OBESITY    History of Present Illness Brian Steele is a 63 year old male with obesity and prediabetes who presents for obesity treatment.  He follows the category three eating plan about fifty percent of the time and engages in physical activities such as walking and yard work for thirty minutes, three to four times per week. He has lost seven pounds over the last six weeks. He experiences decreased hunger levels, which he attributes to the heat, stating 'with this heat, I'm not really that hungry.'  He is currently on metformin  500 mg twice daily for prediabetes and requests a refill. He has been out of metformin  for three to four days due to prescription refill issues. He is working on diet, exercise, and weight loss to help control his glucose levels.  He has a history of emotional eating behaviors and is being treated with Wellbutrin , for which he also requests a refill. He reports stability in his emotional eating behaviors. No significant changes in hunger levels, attributing any decrease to the heat rather than medication changes.      PHYSICAL EXAM:  Blood pressure 121/63, pulse 75, temperature 97.8 F (36.6 C), height 5' 6 (1.676 m), weight 275 lb (124.7 kg), SpO2 97%. Body mass index is 44.39 kg/m.  DIAGNOSTIC DATA REVIEWED:  BMET    Component Value  Date/Time   NA 141 11/18/2023 1615   K 4.2 11/18/2023 1615   CL 105 11/18/2023 1615   CO2 24 11/18/2023 1615   GLUCOSE 82 11/18/2023 1615   GLUCOSE 116 (H) 03/30/2009 1255   BUN 13 11/18/2023 1615   CREATININE 1.05 11/18/2023 1615   CALCIUM 9.2 11/18/2023 1615   GFRNONAA 80 06/22/2020 1219   GFRAA 93 06/22/2020 1219   Lab Results  Component Value Date   HGBA1C 5.3 10/31/2023   HGBA1C 6.1 01/18/2021   Lab Results  Component Value Date   INSULIN  29.1 (H) 08/20/2023   INSULIN  12.5 04/16/2023   Lab Results  Component Value Date   TSH 1.600 10/31/2023   CBC    Component Value Date/Time   WBC 9.7 11/18/2023 1615   WBC 9.8 12/11/2009 1446   RBC 4.85 11/18/2023 1615   RBC 5.46 (A) 02/24/2023 0000   HGB 14.0 11/18/2023 1615   HCT 42.1 11/18/2023 1615   PLT 276 11/18/2023 1615   MCV 87 11/18/2023 1615   MCH 28.9 11/18/2023 1615   MCHC 33.3 11/18/2023 1615   MCHC 34.5 12/11/2009 1446   RDW 13.5 11/18/2023 1615   Iron Studies    Component Value Date/Time   IRON 57 10/31/2023 0837   IRON 54 02/25/2023 0000   TIBC 309 10/31/2023 0837   TIBC 405.00 02/25/2023 0000   FERRITIN 138 10/31/2023 0837   FERRITIN 69.30 02/25/2023 0000   IRONPCTSAT 18 10/31/2023 0837   Lipid Panel  Component Value Date/Time   CHOL 154 10/31/2023 0837   TRIG 83 10/31/2023 0837   HDL 52 10/31/2023 0837   CHOLHDL 3.0 10/31/2023 0837   LDLCALC 86 10/31/2023 0837   Hepatic Function Panel     Component Value Date/Time   PROT 6.8 11/18/2023 1615   ALBUMIN 4.2 11/18/2023 1615   AST 18 11/18/2023 1615   ALT 18 11/18/2023 1615   ALKPHOS 100 11/18/2023 1615   BILITOT 0.5 11/18/2023 1615   BILIDIR 0.2 03/31/2009 0500   IBILI 1.1 (H) 03/31/2009 0500      Component Value Date/Time   TSH 1.600 10/31/2023 0837   Nutritional Lab Results  Component Value Date   VD25OH 38.1 11/18/2023   VD25OH 39.5 10/31/2023   VD25OH 59.7 08/20/2023     Assessment and Plan Assessment &  Plan Obesity He is following a category three eating plan about 50% of the time and engages in physical activity, including walking and yard work, for 30 minutes 3-4 times per week. He has lost 7 pounds in the last 6 weeks. Dietary strategies, including high-protein, cold meals, were discussed to manage hunger, especially in the heat. - Continue category three eating plan - Increase physical activity to 150 minutes per week - Implement dietary strategies for hunger management  Prediabetes He has prediabetes and is on metformin  500 mg twice daily. He is focusing on diet, exercise, and weight loss to control glucose levels. A 90-day supply of metformin  was agreed upon to prevent running out of medication before follow-up visits. Direct communication is encouraged for refill needs due to potential insurance and pharmacy refill denials. - Refill metformin  500 mg bid for 90 days - Continue diet, exercise, and weight loss efforts to control glucose levels  Hypertension Blood pressure was initially elevated at 144/69 but improved to 121/60 upon rechecking. The initial elevation was attributed to heat and physical activity prior to measurement. He is advised to continue diet, exercise, and weight loss to improve blood pressure. - Continue diet, exercise, and weight loss to improve blood pressure  Emotional Eating Behaviors He is treated with Wellbutrin  and reports well-managed emotional eating behaviors. He requested a refill of Wellbutrin . - Refill Wellbutrin  for 90 days  Follow-up No follow-up appointment is scheduled. Plans to see him in 6 weeks to monitor progress on weight loss, prediabetes, and emotional eating behaviors. - Schedule follow-up appointment in 6 weeks - Contact provider via MyChart if issues arise before the next appointment      He was informed of the importance of frequent follow up visits to maximize his success with intensive lifestyle modifications for his multiple health  conditions.    [name], MD

## 2024-05-07 ENCOUNTER — Encounter: Payer: Medicare HMO | Admitting: Family Medicine

## 2024-05-19 ENCOUNTER — Ambulatory Visit (INDEPENDENT_AMBULATORY_CARE_PROVIDER_SITE_OTHER)

## 2024-05-19 ENCOUNTER — Ambulatory Visit: Payer: Self-pay | Admitting: Family Medicine

## 2024-05-19 ENCOUNTER — Encounter: Payer: Self-pay | Admitting: Family Medicine

## 2024-05-19 ENCOUNTER — Ambulatory Visit: Payer: Medicare HMO | Admitting: Family Medicine

## 2024-05-19 VITALS — BP 111/68 | HR 68 | Temp 97.7°F | Ht 66.0 in | Wt 282.8 lb

## 2024-05-19 DIAGNOSIS — I700 Atherosclerosis of aorta: Secondary | ICD-10-CM | POA: Diagnosis not present

## 2024-05-19 DIAGNOSIS — Z23 Encounter for immunization: Secondary | ICD-10-CM

## 2024-05-19 DIAGNOSIS — R0609 Other forms of dyspnea: Secondary | ICD-10-CM

## 2024-05-19 DIAGNOSIS — H40013 Open angle with borderline findings, low risk, bilateral: Secondary | ICD-10-CM | POA: Diagnosis not present

## 2024-05-19 DIAGNOSIS — I350 Nonrheumatic aortic (valve) stenosis: Secondary | ICD-10-CM

## 2024-05-19 DIAGNOSIS — Z0000 Encounter for general adult medical examination without abnormal findings: Secondary | ICD-10-CM | POA: Diagnosis not present

## 2024-05-19 DIAGNOSIS — I10 Essential (primary) hypertension: Secondary | ICD-10-CM

## 2024-05-19 DIAGNOSIS — R7303 Prediabetes: Secondary | ICD-10-CM

## 2024-05-19 DIAGNOSIS — Z0001 Encounter for general adult medical examination with abnormal findings: Secondary | ICD-10-CM | POA: Diagnosis not present

## 2024-05-19 DIAGNOSIS — E538 Deficiency of other specified B group vitamins: Secondary | ICD-10-CM | POA: Diagnosis not present

## 2024-05-19 DIAGNOSIS — R3912 Poor urinary stream: Secondary | ICD-10-CM

## 2024-05-19 DIAGNOSIS — L03116 Cellulitis of left lower limb: Secondary | ICD-10-CM | POA: Diagnosis not present

## 2024-05-19 DIAGNOSIS — E782 Mixed hyperlipidemia: Secondary | ICD-10-CM

## 2024-05-19 DIAGNOSIS — L03115 Cellulitis of right lower limb: Secondary | ICD-10-CM

## 2024-05-19 LAB — BAYER DCA HB A1C WAIVED: HB A1C (BAYER DCA - WAIVED): 5.5 % (ref 4.8–5.6)

## 2024-05-19 LAB — LIPID PANEL

## 2024-05-19 MED ORDER — DOXYCYCLINE HYCLATE 100 MG PO TABS: 100.0000 mg | ORAL_TABLET | Freq: Two times a day (BID) | ORAL | 0 refills | Status: AC

## 2024-05-19 MED ORDER — SIMVASTATIN 40 MG PO TABS: 40.0000 mg | ORAL_TABLET | Freq: Every day | ORAL | 3 refills | Status: AC

## 2024-05-19 NOTE — Progress Notes (Signed)
 Complete physical exam  Patient: Brian Steele   DOB: 02/07/1961   63 y.o. Male  MRN: [id number]  Subjective:    Chief Complaint  Patient presents with   Annual Exam    Brian Steele is a 63 y.o. male who presents today for a complete physical exam. He reports consuming a general diet. The patient does not participate in regular exercise at present. He generally feels well. He reports sleeping well. He does have additional problems to discuss today.    The patient presents for an annual physical exam. He is accompanied by a family member.  Lower extremity skin lesions - Sores present on legs, described as 'horrible' - Lesions are pruritic - Attempts to avoid picking at the sores - Self-treatment with rubbing alcohol and calamine lotion  Respiratory symptoms - History of lung issues - Previously treated with Breztri , now discontinued - Currently uses albuterol  as needed - Shortness of breath primarily associated with anxiety - Exertional dyspnea, especially when walking up and down stairs  Cognitive changes - Discontinued B12 supplementation after prescription ran out - Experiencing forgetfulness, with more prominent short-term memory issues - Family member perceives possible early senility  Peripheral edema - Possible swelling in legs, uncertain extent ('I think so')  Constitutional and other symptoms - No chest pain - No palpitations - No changes in vision or hearing - No changes in bowel habits or urination      Most recent fall risk assessment:    02/16/2024    8:44 AM  Fall Risk   Falls in the past year? 0  Injury with Fall? 0  Risk for fall due to : Medication side effect  Follow up Falls prevention discussed;Falls evaluation completed     Most recent depression screenings:    05/19/2024    8:09 AM 02/20/2024   10:11 AM  PHQ 2/9 Scores  PHQ - 2 Score 0 0  PHQ- 9 Score 0     Vision:Not within last year  and Dental: No current dental  problems  Patient Active Problem List   Diagnosis Date Noted   Benign hypertension 05/04/2024   Bilateral Lower Extremity Rash 03/15/2024   Seasonal allergic rhinitis due to pollen 10/31/2023   B12 nutritional deficiency 08/20/2023   Other secondary hypertension 07/30/2023   Depression 07/17/2023   Primary osteoarthritis of right knee 05/14/2023   Former heavy cigarette smoker (20-39 per day) 11/13/2022   Vitamin D  deficiency 11/13/2022   Aortic atherosclerosis (HCC) 01/12/2022   Prediabetes 01/18/2021   Morbid obesity (HCC) 06/22/2020   Hyperlipidemia    Ulnar neuropathy of left upper extremity 10/05/2016   Sleep apnea 11/12/2015   Chronic pain syndrome 01/24/2015   External hemorrhoids 10/12/2008   Gastroesophageal reflux disease 10/12/2008   DIARRHEA, FUNCTIONAL 10/12/2008   Past Medical History:  Diagnosis Date   Allergy    Aortic atherosclerosis (HCC) 01/12/2022   Arthritis    Back pain    Barrett's esophagus    Bilateral swelling of feet    Chest pain    COPD (chronic obstructive pulmonary disease) (HCC)    Gallbladder problem    GERD (gastroesophageal reflux disease)    Hyperlipidemia    Obstructive sleep apnea    Prediabetes    Sleep apnea    SOB (shortness of breath)    Vitamin D  deficiency    Past Surgical History:  Procedure Laterality Date   APPENDECTOMY     CARPAL TUNNEL RELEASE     CHOLECYSTECTOMY  2013   KNEE ARTHROSCOPY     meniscal repair   NECK SURGERY     has plates   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   SHOULDER SURGERY Bilateral    Social History   Tobacco Use   Smoking status: Former    Current packs/day: 0.00    Average packs/day: 2.0 packs/day for 26.0 years (52.0 ttl pk-yrs)    Types: Cigarettes    Start date: 45    Quit date: 2018    Years since quitting: 7.5   Smokeless tobacco: Never  Vaping Use   Vaping status: Never Used  Substance Use Topics   Alcohol use: Not Currently   Drug use: Never   Social History    Socioeconomic History   Marital status: Married    Spouse name: Not on file   Number of children: Not on file   Years of education: Not on file   Highest education level: 12th grade  Occupational History   Occupation: Retired  Tobacco Use   Smoking status: Former    Current packs/day: 0.00    Average packs/day: 2.0 packs/day for 26.0 years (52.0 ttl pk-yrs)    Types: Cigarettes    Start date: 13    Quit date: 2018    Years since quitting: 7.5   Smokeless tobacco: Never  Vaping Use   Vaping status: Never Used  Substance and Sexual Activity   Alcohol use: Not Currently   Drug use: Never   Sexual activity: Not Currently    Birth control/protection: None  Other Topics Concern   Not on file  Social History Narrative   ** Merged History Encounter **       Social Drivers of Health   Financial Resource Strain: Low Risk  (05/15/2024)   Overall Financial Resource Strain (CARDIA)    Difficulty of Paying Living Expenses: Not hard at all  Food Insecurity: No Food Insecurity (05/15/2024)   Hunger Vital Sign    Worried About Running Out of Food in the Last Year: Never true    Ran Out of Food in the Last Year: Never true  Transportation Needs: No Transportation Needs (05/15/2024)   PRAPARE - Administrator, Civil Service (Medical): No    Lack of Transportation (Non-Medical): No  Physical Activity: Unknown (05/15/2024)   Exercise Vital Sign    Days of Exercise per Week: Patient declined    Minutes of Exercise per Session: Not on file  Recent Concern: Physical Activity - Insufficiently Active (02/19/2024)   Exercise Vital Sign    Days of Exercise per Week: 3 days    Minutes of Exercise per Session: 30 min  Stress: No Stress Concern Present (05/15/2024)   [employer] of Occupational Health - Occupational Stress Questionnaire    Feeling of Stress: Only a little  Social Connections: Moderately Isolated (05/15/2024)   Social Connection and Isolation Panel    Frequency of  Communication with Friends and Family: More than three times a week    Frequency of Social Gatherings with Friends and Family: Once a week    Attends Religious Services: Patient declined    Database administrator or Organizations: No    Attends Engineer, structural: Not on file    Marital Status: Married  Intimate Partner Violence: Not At Risk (02/20/2024)   Humiliation, Afraid, Rape, and Kick questionnaire    Fear of Current or Ex-Partner: No    Emotionally Abused: No    Physically Abused: No  Sexually Abused: No   Family Status  Relation Name Status   Mother Curtistine Alive   Father Curtistine Deceased   Sister 1 Alive   Brother 1 Alive   MGM  Deceased   MGF  Deceased   PGM  Deceased   PGF  Deceased   Daughter 2 Alive   Son 1 Alive  No partnership data on file   Family History  Problem Relation Age of Onset   Arthritis/Rheumatoid Mother    Cancer Mother    Diabetes Mother    Cancer Father    Diabetes Father    Kidney cancer Father    Obesity Father    Hypertension Brother    Hyperlipidemia Brother    Obesity Daughter    Allergies  Allergen Reactions   Bee Venom Swelling   Other Hives    In laundry detergents   In laundry detergents   Sulfa Antibiotics Swelling   Sodium Hypochlorite Rash      Patient Care Team: Severa Rock HERO, FNP as PCP - General (Family Medicine) Powell Bradley, PA-C as Physician Assistant (Physician Assistant) Lewanda Vinie BROCKS., MD as Referring Physician (Sports Medicine)   Outpatient Medications Prior to Visit  Medication Sig   albuterol  (VENTOLIN  HFA) 108 (90 Base) MCG/ACT inhaler TAKE 2 PUFFS BY MOUTH EVERY 6 HOURS AS NEEDED FOR WHEEZE OR SHORTNESS OF BREATH   aspirin 81 MG EC tablet Take 1 tablet by mouth daily.   buPROPion  (WELLBUTRIN  SR) 150 MG 12 hr tablet Take 1 tablet (150 mg total) by mouth every morning.   fluticasone  (FLONASE ) 50 MCG/ACT nasal spray Place 2 sprays into both nostrils daily.   HYDROcodone-acetaminophen   (NORCO/VICODIN) 5-325 MG tablet Take 1 tablet by mouth 2 (two) times daily.   levocetirizine (XYZAL ) 5 MG tablet TAKE 1 TABLET BY MOUTH EVERY DAY IN THE EVENING   metFORMIN  (GLUCOPHAGE ) 500 MG tablet Take 1 tablet (500 mg total) by mouth 2 (two) times daily with a meal.   naloxone (NARCAN) nasal spray 4 mg/0.1 mL Place into the nose.   omeprazole  (PRILOSEC) 20 MG capsule TAKE 1 CAPSULE BY MOUTH EVERY DAY   tiZANidine (ZANAFLEX) 4 MG tablet Take 4 mg by mouth at bedtime as needed.   [DISCONTINUED] Budeson-Glycopyrrol-Formoterol (BREZTRI  AEROSPHERE) 160-9-4.8 MCG/ACT AERO Inhale 2 puffs into the lungs 2 (two) times daily.   [DISCONTINUED] simvastatin  (ZOCOR ) 40 MG tablet Take 1 tablet (40 mg total) by mouth daily.   No facility-administered medications prior to visit.   ROS per HPI      Objective:     BP 111/68   Pulse 68   Temp 97.7 F (36.5 C)   Ht 5' 6 (1.676 m)   Wt 282 lb 12.8 oz (128.3 kg)   SpO2 96%   BMI 45.65 kg/m  BP Readings from Last 3 Encounters:  05/19/24 111/68  05/04/24 121/63  03/15/24 122/71   Wt Readings from Last 3 Encounters:  05/19/24 282 lb 12.8 oz (128.3 kg)  05/04/24 275 lb (124.7 kg)  03/15/24 282 lb (127.9 kg)   SpO2 Readings from Last 3 Encounters:  05/19/24 96%  05/04/24 97%  03/15/24 98%      Physical Exam Vitals and nursing note reviewed.  Constitutional:      General: He is not in acute distress.    Appearance: Normal appearance. He is well-developed and well-groomed. He is morbidly obese. He is not ill-appearing, toxic-appearing or diaphoretic.  HENT:     Head: Normocephalic and atraumatic.  Jaw: There is normal jaw occlusion.     Right Ear: Hearing, tympanic membrane, ear canal and external ear normal.     Left Ear: Hearing, tympanic membrane, ear canal and external ear normal.     Nose: Nose normal.     Mouth/Throat:     Lips: Pink.     Mouth: Mucous membranes are moist.     Pharynx: Oropharynx is clear. Uvula midline.   Eyes:     General: Lids are normal.     Extraocular Movements: Extraocular movements intact.     Conjunctiva/sclera: Conjunctivae normal.     Pupils: Pupils are equal, round, and reactive to light.  Neck:     Thyroid : No thyroid  mass, thyromegaly or thyroid  tenderness.     Vascular: No carotid bruit or JVD.     Trachea: Trachea and phonation normal.  Cardiovascular:     Rate and Rhythm: Normal rate and regular rhythm.     Chest Wall: PMI is not displaced.     Pulses: Normal pulses.     Heart sounds: Normal heart sounds. No murmur heard.    No friction rub. No gallop.  Pulmonary:     Effort: Pulmonary effort is normal. Prolonged expiration present. No respiratory distress.     Breath sounds: Wheezing (scattered) present.  Abdominal:     General: Bowel sounds are normal. There is no distension or abdominal bruit.     Palpations: Abdomen is soft. There is no hepatomegaly or splenomegaly.     Tenderness: There is no abdominal tenderness. There is no right CVA tenderness or left CVA tenderness.     Hernia: No hernia is present.  Musculoskeletal:        General: Normal range of motion.     Cervical back: Normal range of motion and neck supple.     Right lower leg: 1+ Edema present.     Left lower leg: 1+ Edema present.  Lymphadenopathy:     Cervical: No cervical adenopathy.  Skin:    General: Skin is warm and dry.     Capillary Refill: Capillary refill takes less than 2 seconds.     Coloration: Skin is not cyanotic, jaundiced or pale.     Findings: Erythema and wound present. No rash.     Comments: BLE: erythema with several open wounds. No drainage. Slight swelling.  Neurological:     General: No focal deficit present.     Mental Status: He is alert and oriented to person, place, and time.     Sensory: Sensation is intact.     Motor: Motor function is intact.     Coordination: Coordination is intact.     Gait: Gait is intact.     Deep Tendon Reflexes: Reflexes are normal and  symmetric.  Psychiatric:        Attention and Perception: Attention and perception normal.        Mood and Affect: Mood and affect normal.        Speech: Speech normal.        Behavior: Behavior normal. Behavior is cooperative.        Thought Content: Thought content normal.        Cognition and Memory: Cognition and memory normal.        Judgment: Judgment normal.      EKG:SR with RBBB, 69, PVCs, no acute ST-T changes, PR 174 ms, QT 442 ms. No significant changes from prior EKG. Rosaline Bruns, FNP-C   Last CBC Lab Results  Component Value Date   WBC 9.7 11/18/2023   HGB 14.0 11/18/2023   HCT 42.1 11/18/2023   MCV 87 11/18/2023   MCH 28.9 11/18/2023   RDW 13.5 11/18/2023   PLT 276 11/18/2023   Last metabolic panel Lab Results  Component Value Date   GLUCOSE 82 11/18/2023   NA 141 11/18/2023   K 4.2 11/18/2023   CL 105 11/18/2023   CO2 24 11/18/2023   BUN 13 11/18/2023   CREATININE 1.05 11/18/2023   EGFR 80 11/18/2023   CALCIUM 9.2 11/18/2023   PROT 6.8 11/18/2023   ALBUMIN 4.2 11/18/2023   LABGLOB 2.6 11/18/2023   LABGLOB 3.0 11/18/2023   AGRATIO 1.3 11/18/2023   BILITOT 0.5 11/18/2023   ALKPHOS 100 11/18/2023   AST 18 11/18/2023   ALT 18 11/18/2023   Last lipids Lab Results  Component Value Date   CHOL 154 10/31/2023   HDL 52 10/31/2023   LDLCALC 86 10/31/2023   TRIG 83 10/31/2023   CHOLHDL 3.0 10/31/2023   Last hemoglobin A1c Lab Results  Component Value Date   HGBA1C 5.3 10/31/2023   Last thyroid  functions Lab Results  Component Value Date   TSH 1.600 10/31/2023   T4TOTAL 7.7 10/31/2023   Last vitamin D  Lab Results  Component Value Date   VD25OH 38.1 11/18/2023   Last vitamin B12 and Folate Lab Results  Component Value Date   VITAMINB12 504 10/31/2023   FOLATE 6.2 10/31/2023        Assessment & Plan:    Routine Health Maintenance and Physical Exam  Immunization History  Administered Date(s) Administered   Influenza, Seasonal,  Injecte, Preservative Fre 09/11/2023   Influenza,inj,Quad PF,6+ Mos 08/03/2015   Influenza,inj,quad, With Preservative 09/21/2014   Influenza-Unspecified 09/21/2014   Moderna Sars-Covid-2 Vaccination 03/10/2020, 04/07/2020, 11/06/2020   Pneumococcal Conjugate-13 10/11/2015    Health Maintenance  Topic Date Due   Zoster Vaccines- Shingrix  (1 of 2) Never done   Lung Cancer Screening  06/11/2024   COVID-19 Vaccine (4 - 2024-25 season) 06/04/2024 (Originally 07/13/2023)   Pneumococcal Vaccine 28-61 Years old (2 of 2 - PPSV23, PCV20, or PCV21) 12/09/2024 (Originally 12/06/2015)   INFLUENZA VACCINE  06/11/2024   Medicare Annual Wellness (AWV)  02/19/2025   Colonoscopy  02/08/2026   Hepatitis C Screening  Completed   Hepatitis B Vaccines  Aged Out   HPV VACCINES  Aged Out   Meningococcal B Vaccine  Aged Out   DTaP/Tdap/Td  Discontinued   HIV Screening  Discontinued    Discussed health benefits of physical activity, and encouraged him to engage in regular exercise appropriate for his age and condition.  Problem List Items Addressed This Visit       Cardiovascular and Mediastinum   Aortic atherosclerosis (HCC)   Relevant Medications   simvastatin  (ZOCOR ) 40 MG tablet   Other Relevant Orders   CBC with Differential/Platelet   CMP14+EGFR   Lipid panel   EKG 12-Lead (Completed)     Other   Hyperlipidemia   Relevant Medications   simvastatin  (ZOCOR ) 40 MG tablet   Other Relevant Orders   CMP14+EGFR   Lipid panel   Morbid obesity (HCC)   Relevant Orders   CBC with Differential/Platelet   CMP14+EGFR   Lipid panel   Thyroid  Panel With TSH   Vitamin B12   Bayer DCA Hb A1c Waived   Prediabetes   Relevant Medications   simvastatin  (ZOCOR ) 40 MG tablet   Other Relevant Orders   CMP14+EGFR   Lipid  panel   Bayer DCA Hb A1c Waived   B12 nutritional deficiency   Relevant Orders   CBC with Differential/Platelet   Vitamin B12   Other Visit Diagnoses       Annual physical exam     -  Primary   Relevant Orders   CBC with Differential/Platelet   CMP14+EGFR   Lipid panel     Primary hypertension- controlling with diet       Relevant Medications   simvastatin  (ZOCOR ) 40 MG tablet   Other Relevant Orders   CBC with Differential/Platelet   CMP14+EGFR   Lipid panel   Thyroid  Panel With TSH   EKG 12-Lead (Completed)     Weak urinary stream       Relevant Orders   PSA, total and free     Cellulitis of both lower extremities       Relevant Medications   doxycycline  (VIBRA -TABS) 100 MG tablet     DOE (dyspnea on exertion)       Relevant Orders   EKG 12-Lead (Completed)   ECHOCARDIOGRAM COMPLETE   DG Chest 2 View   Brain natriuretic peptide     Shingrix  vaccination administered     Dyspnea on exertion Reports shortness of breath with exertion, such as walking up and down stairs, and associated leg swelling, suggesting potential cardiac or pulmonary issues. He uses albuterol  as needed after discontinuing Breztri . - Order EKG and echocardiogram to assess cardiac function. - Order chest x-ray to investigate pulmonary causes. - Perform lab work to identify contributing factors. - Follow-up with pulmonologist in August or September.  Cellulitis Cellulitis of the legs with sores and itching. Current use of rubbing alcohol and calamine lotion is ineffective. Requires antibiotics and emollient therapy. - Prescribe antibiotics for 10 days. - Advise discontinuation of rubbing alcohol. - Recommend daily use of Eucerin, CeraVe, or Cetaphil lotion. - Schedule follow-up in 2-4 weeks to assess improvement.  Memory loss Exhibits forgetfulness and short-term memory loss, possibly related to discontinuation of B12 supplementation. Symptoms are noticeable but not severe. - Check B12 levels for deficiency.  General Health Maintenance Due for lung cancer screening next month. Lacks regular visits to eye doctor or dentist. Eye doctor appointment scheduled today for follow-up  on previous findings. - Ensure lung cancer screening is scheduled for next month. - Encourage regular visits to eye doctor and dentist.       Return in about 2 weeks (around 06/02/2024) for cellulitis .     Rosaline Bruns, FNP

## 2024-05-20 LAB — CMP14+EGFR
ALT: 16 IU/L (ref 0–44)
AST: 18 IU/L (ref 0–40)
Albumin: 4.3 g/dL (ref 3.9–4.9)
Alkaline Phosphatase: 97 IU/L (ref 44–121)
BUN/Creatinine Ratio: 11 (ref 10–24)
BUN: 13 mg/dL (ref 8–27)
Bilirubin Total: 0.6 mg/dL (ref 0.0–1.2)
CO2: 18 mmol/L — AB (ref 20–29)
Calcium: 8.7 mg/dL (ref 8.6–10.2)
Chloride: 105 mmol/L (ref 96–106)
Creatinine, Ser: 1.16 mg/dL (ref 0.76–1.27)
Globulin, Total: 2.7 g/dL (ref 1.5–4.5)
Glucose: 79 mg/dL (ref 70–99)
Potassium: 4.1 mmol/L (ref 3.5–5.2)
Sodium: 141 mmol/L (ref 134–144)
Total Protein: 7 g/dL (ref 6.0–8.5)
eGFR: 71 mL/min/1.73 (ref >59)

## 2024-05-20 LAB — CBC WITH DIFFERENTIAL/PLATELET
Basophils Absolute: 0.1 x10E3/uL (ref 0.0–0.2)
Basos: 1 %
EOS (ABSOLUTE): 0.5 x10E3/uL — ABNORMAL HIGH (ref 0.0–0.4)
Eos: 6 %
Hematocrit: 42.4 % (ref 37.5–51.0)
Hemoglobin: 13.9 g/dL (ref 13.0–17.7)
Immature Grans (Abs): 0 x10E3/uL (ref 0.0–0.1)
Immature Granulocytes: 0 %
Lymphocytes Absolute: 2.7 x10E3/uL (ref 0.7–3.1)
Lymphs: 29 %
MCH: 28.2 pg (ref 26.6–33.0)
MCHC: 32.8 g/dL (ref 31.5–35.7)
MCV: 86 fL (ref 79–97)
Monocytes Absolute: 0.8 x10E3/uL (ref 0.1–0.9)
Monocytes: 9 %
Neutrophils Absolute: 5.4 x10E3/uL (ref 1.4–7.0)
Neutrophils: 55 %
Platelets: 263 x10E3/uL (ref 150–450)
RBC: 4.93 x10E6/uL (ref 4.14–5.80)
RDW: 14.2 % (ref 11.6–15.4)
WBC: 9.6 x10E3/uL (ref 3.4–10.8)

## 2024-05-20 LAB — LIPID PANEL
Cholesterol, Total: 135 mg/dL (ref 100–199)
HDL: 42 mg/dL (ref >39)
LDL CALC COMMENT:: 3.2 ratio (ref 0.0–5.0)
LDL Chol Calc (NIH): 65 mg/dL (ref 0–99)
Triglycerides: 165 mg/dL — AB (ref 0–149)
VLDL Cholesterol Cal: 28 mg/dL (ref 5–40)

## 2024-05-20 LAB — THYROID PANEL WITH TSH
Free Thyroxine Index: 2.4 (ref 1.2–4.9)
T3 Uptake Ratio: 31 (ref 24–39)
T4, Total: 7.6 ug/dL (ref 4.5–12.0)
TSH: 1.7 u[IU]/mL (ref 0.450–4.500)

## 2024-05-20 LAB — PSA, TOTAL AND FREE
PSA, Free Pct: 30.9
PSA, Free: 0.34 ng/mL
Prostate Specific Ag, Serum: 1.1 ng/mL (ref 0.0–4.0)

## 2024-05-20 LAB — BRAIN NATRIURETIC PEPTIDE: BNP: 18.9 pg/mL (ref 0.0–100.0)

## 2024-05-20 LAB — VITAMIN B12: Vitamin B-12: 423 pg/mL (ref 232–1245)

## 2024-05-24 DIAGNOSIS — R0989 Other specified symptoms and signs involving the circulatory and respiratory systems: Secondary | ICD-10-CM | POA: Diagnosis not present

## 2024-05-24 DIAGNOSIS — M79604 Pain in right leg: Secondary | ICD-10-CM | POA: Diagnosis not present

## 2024-05-24 DIAGNOSIS — M79605 Pain in left leg: Secondary | ICD-10-CM | POA: Diagnosis not present

## 2024-05-25 ENCOUNTER — Ambulatory Visit: Attending: Internal Medicine

## 2024-05-25 DIAGNOSIS — R0609 Other forms of dyspnea: Secondary | ICD-10-CM

## 2024-05-25 LAB — ECHOCARDIOGRAM COMPLETE
AR max vel: 1.76 cm2
AV Area VTI: 1.93 cm2
AV Area mean vel: 1.79 cm2
AV Mean grad: 9 mmHg
AV Peak grad: 16.8 mmHg
AV Vena cont: 0.9 cm
Ao pk vel: 2.05 m/s
Area-P 1/2: 3.5 cm2
Calc EF: 57 %
MV VTI: 2.59 cm2
P 1/2 time: 574 ms
S' Lateral: 2.8 cm
Single Plane A2C EF: 58 %
Single Plane A4C EF: 55.4 %

## 2024-06-03 ENCOUNTER — Ambulatory Visit (INDEPENDENT_AMBULATORY_CARE_PROVIDER_SITE_OTHER): Admitting: Family Medicine

## 2024-06-03 ENCOUNTER — Encounter: Payer: Self-pay | Admitting: Family Medicine

## 2024-06-03 VITALS — BP 132/67 | HR 77 | Temp 97.7°F | Ht 66.0 in | Wt 279.6 lb

## 2024-06-03 DIAGNOSIS — I350 Nonrheumatic aortic (valve) stenosis: Secondary | ICD-10-CM

## 2024-06-03 DIAGNOSIS — L03115 Cellulitis of right lower limb: Secondary | ICD-10-CM

## 2024-06-03 DIAGNOSIS — L03116 Cellulitis of left lower limb: Secondary | ICD-10-CM | POA: Diagnosis not present

## 2024-06-03 MED ORDER — DOXYCYCLINE HYCLATE 100 MG PO TABS: 100.0000 mg | ORAL_TABLET | Freq: Two times a day (BID) | ORAL | 0 refills | Status: AC

## 2024-06-03 NOTE — Progress Notes (Signed)
 Subjective:  Patient ID: Brian Steele, male    DOB: 1961-03-26, 63 y.o.   MRN: [id number]  Patient Care Team: Severa Rock HERO, FNP as PCP - General (Family Medicine) Powell Slater RIGGERS as Physician Assistant (Physician Assistant) Lewanda Vinie BROCKS., MD as Referring Physician (Sports Medicine)   Chief Complaint:  Cellulitis (2 week follow up -  bilateral lower legs )   HPI: Brian Steele is a 63 y.o. male presenting on 06/03/2024 for Cellulitis (2 week follow up -  bilateral lower legs )   Brian Steele is a 63 year old male who presents for follow-up of cellulitis.  He is following up for cellulitis. He has been washing the affected area twice a day with soap and water and applying lotion. He completed a 10-day course of doxycycline .  He has a history of aortic valve stenosis and regurgitation, identified during an echocardiogram performed on May 25, 2024. He experiences shortness of breath and fatigue. He is awaiting a cardiology appointment scheduled for August 09, 2024.  His current medications include simvastatin  and aspirin.          Relevant past medical, surgical, family, and social history reviewed and updated as indicated.  Allergies and medications reviewed and updated. Data reviewed: Chart in Epic.   Past Medical History:  Diagnosis Date   Allergy    Aortic atherosclerosis (HCC) 01/12/2022   Arthritis    Back pain    Barrett's esophagus    Bilateral swelling of feet    Chest pain    COPD (chronic obstructive pulmonary disease) (HCC)    Gallbladder problem    GERD (gastroesophageal reflux disease)    Hyperlipidemia    Obstructive sleep apnea    Prediabetes    Sleep apnea    SOB (shortness of breath)    Vitamin D  deficiency     Past Surgical History:  Procedure Laterality Date   APPENDECTOMY     CARPAL TUNNEL RELEASE     CHOLECYSTECTOMY  2013   KNEE ARTHROSCOPY     meniscal repair   NECK SURGERY     has plates   REPLACEMENT TOTAL KNEE  Right 07/14/2019   SHOULDER SURGERY Bilateral     Social History   Socioeconomic History   Marital status: Married    Spouse name: Not on file   Number of children: Not on file   Years of education: Not on file   Highest education level: 12th grade  Occupational History   Occupation: Retired  Tobacco Use   Smoking status: Former    Current packs/day: 0.00    Average packs/day: 2.0 packs/day for 26.0 years (52.0 ttl pk-yrs)    Types: Cigarettes    Start date: 51    Quit date: 2018    Years since quitting: 7.5   Smokeless tobacco: Never  Vaping Use   Vaping status: Never Used  Substance and Sexual Activity   Alcohol use: Not Currently   Drug use: Never   Sexual activity: Not Currently    Birth control/protection: None  Other Topics Concern   Not on file  Social History Narrative   ** Merged History Encounter **       Social Drivers of Health   Financial Resource Strain: Low Risk  (05/15/2024)   Overall Financial Resource Strain (CARDIA)    Difficulty of Paying Living Expenses: Not hard at all  Food Insecurity: No Food Insecurity (05/15/2024)   Hunger Vital Sign    Worried  About Running Out of Food in the Last Year: Never true    Ran Out of Food in the Last Year: Never true  Transportation Needs: No Transportation Needs (05/15/2024)   PRAPARE - Administrator, Civil Service (Medical): No    Lack of Transportation (Non-Medical): No  Physical Activity: Unknown (05/15/2024)   Exercise Vital Sign    Days of Exercise per Week: Patient declined    Minutes of Exercise per Session: Not on file  Recent Concern: Physical Activity - Insufficiently Active (02/19/2024)   Exercise Vital Sign    Days of Exercise per Week: 3 days    Minutes of Exercise per Session: 30 min  Stress: No Stress Concern Present (05/15/2024)   [employer] of Occupational Health - Occupational Stress Questionnaire    Feeling of Stress: Only a little  Social Connections: Moderately Isolated  (05/15/2024)   Social Connection and Isolation Panel    Frequency of Communication with Friends and Family: More than three times a week    Frequency of Social Gatherings with Friends and Family: Once a week    Attends Religious Services: Patient declined    Database administrator or Organizations: No    Attends Engineer, structural: Not on file    Marital Status: Married  Catering manager Violence: Not At Risk (02/20/2024)   Humiliation, Afraid, Rape, and Kick questionnaire    Fear of Current or Ex-Partner: No    Emotionally Abused: No    Physically Abused: No    Sexually Abused: No    Outpatient Encounter Medications as of 06/03/2024  Medication Sig   albuterol  (VENTOLIN  HFA) 108 (90 Base) MCG/ACT inhaler TAKE 2 PUFFS BY MOUTH EVERY 6 HOURS AS NEEDED FOR WHEEZE OR SHORTNESS OF BREATH   aspirin 81 MG EC tablet Take 1 tablet by mouth daily.   buPROPion  (WELLBUTRIN  SR) 150 MG 12 hr tablet Take 1 tablet (150 mg total) by mouth every morning.   doxycycline  (VIBRA -TABS) 100 MG tablet Take 1 tablet (100 mg total) by mouth 2 (two) times daily for 5 days. 1 po bid   fluticasone  (FLONASE ) 50 MCG/ACT nasal spray Place 2 sprays into both nostrils daily.   HYDROcodone-acetaminophen  (NORCO/VICODIN) 5-325 MG tablet Take 1 tablet by mouth 2 (two) times daily.   levocetirizine (XYZAL ) 5 MG tablet TAKE 1 TABLET BY MOUTH EVERY DAY IN THE EVENING   metFORMIN  (GLUCOPHAGE ) 500 MG tablet Take 1 tablet (500 mg total) by mouth 2 (two) times daily with a meal.   naloxone (NARCAN) nasal spray 4 mg/0.1 mL Place into the nose.   omeprazole  (PRILOSEC) 20 MG capsule TAKE 1 CAPSULE BY MOUTH EVERY DAY   simvastatin  (ZOCOR ) 40 MG tablet Take 1 tablet (40 mg total) by mouth daily.   tiZANidine (ZANAFLEX) 4 MG tablet Take 4 mg by mouth at bedtime as needed.   No facility-administered encounter medications on file as of 06/03/2024.    Allergies  Allergen Reactions   Bee Venom Swelling   Other Hives    In  laundry detergents   In laundry detergents   Sulfa Antibiotics Swelling   Sodium Hypochlorite Rash    Pertinent ROS per HPI, otherwise unremarkable      Objective:  BP 132/67   Pulse 77   Temp 97.7 F (36.5 C)   Ht 5' 6 (1.676 m)   Wt 279 lb 9.6 oz (126.8 kg)   SpO2 95%   BMI 45.13 kg/m    Wt Readings from  Last 3 Encounters:  06/03/24 279 lb 9.6 oz (126.8 kg)  05/19/24 282 lb 12.8 oz (128.3 kg)  05/04/24 275 lb (124.7 kg)    Physical Exam Vitals and nursing note reviewed.  Constitutional:      General: He is not in acute distress.    Appearance: Normal appearance. He is obese. He is not ill-appearing, toxic-appearing or diaphoretic.  HENT:     Head: Normocephalic and atraumatic.     Nose: Nose normal.     Mouth/Throat:     Mouth: Mucous membranes are moist.  Eyes:     Pupils: Pupils are equal, round, and reactive to light.  Cardiovascular:     Rate and Rhythm: Normal rate and regular rhythm.     Heart sounds: Murmur heard.     Systolic murmur is present with a grade of 1/6.  Pulmonary:     Effort: Pulmonary effort is normal. Prolonged expiration present.     Breath sounds: Normal breath sounds.  Skin:    General: Skin is warm and dry.     Capillary Refill: Capillary refill takes less than 2 seconds.     Comments:  BLE: erythema with several open wounds. No drainage. Slight swelling - improved from previous visit  Neurological:     General: No focal deficit present.     Mental Status: He is alert and oriented to person, place, and time.  Psychiatric:        Mood and Affect: Mood normal.        Behavior: Behavior normal.        Thought Content: Thought content normal.        Judgment: Judgment normal.      Results for orders placed or performed in visit on 05/25/24  ECHOCARDIOGRAM COMPLETE   Collection Time: 05/25/24  1:02 PM  Result Value Ref Range   S' Lateral 2.80 cm   Area-P 1/2 3.50 cm2   Single Plane A2C EF 58.0 %   Single Plane A4C EF 55.4 %    Calc EF 57.0 %   AV Area VTI 1.93 cm2   MV VTI 2.59 cm2   AV Area mean vel 1.79 cm2   AR max vel 1.76 cm2   P 1/2 time 574 msec   Ao pk vel 2.05 m/s   AV Mean grad 9.0 mmHg   AV Peak grad 16.8 mmHg   AV Vena cont 0.90 cm   Est EF 60 - 65%        Pertinent labs & imaging results that were available during my care of the patient were reviewed by me and considered in my medical decision making.  Assessment & Plan:  Kaya was seen today for cellulitis.  Diagnoses and all orders for this visit:  Cellulitis of both lower extremities -     doxycycline  (VIBRA -TABS) 100 MG tablet; Take 1 tablet (100 mg total) by mouth 2 (two) times daily for 5 days. 1 po bid  Nonrheumatic aortic valve stenosis Follow up with cardiology as discussed.     Aortic valve stenosis and regurgitation Aortic valve stenosis and regurgitation confirmed by echocardiogram, presenting with shortness of breath and fatigue. Current management includes simvastatin  and aspirin to control blood pressure, weight, and cholesterol. Emphasized the importance of cardiology follow-up to monitor progression and consider intervention if necessary. - Continue simvastatin  and aspirin - Monitor blood pressure, weight, and cholesterol - Schedule cardiology follow-up  Cellulitis Cellulitis improving with current treatment. Completed 10-day course of doxycycline . Plan to  extend antibiotics for an additional 5 days to ensure complete resolution. Advised to monitor for complete healing and report if not resolved after 5 days, with an additional week of observation if necessary. - Continue doxycycline  for 5 more days - Wash affected area twice daily with soap and water - Apply lotion to affected area - Monitor for complete healing; if not resolved after 5 days, wait an additional week and reassess        Continue all other maintenance medications.  Follow up plan: Return if symptoms worsen or fail to improve.   Continue healthy  lifestyle choices, including diet (rich in fruits, vegetables, and lean proteins, and low in salt and simple carbohydrates) and exercise (at least 30 minutes of moderate physical activity daily).  Educational handout given for aortic valve stenosis, cellulitis   The above assessment and management plan was discussed with the patient. The patient verbalized understanding of and has agreed to the management plan. Patient is aware to call the clinic if they develop any new symptoms or if symptoms persist or worsen. Patient is aware when to return to the clinic for a follow-up visit. Patient educated on when it is appropriate to go to the emergency department.   Rosaline Bruns, FNP-C [facility] [phone]

## 2024-06-15 ENCOUNTER — Ambulatory Visit (INDEPENDENT_AMBULATORY_CARE_PROVIDER_SITE_OTHER): Admitting: Family Medicine

## 2024-06-15 ENCOUNTER — Encounter (INDEPENDENT_AMBULATORY_CARE_PROVIDER_SITE_OTHER): Payer: Self-pay | Admitting: Family Medicine

## 2024-06-15 VITALS — BP 138/83 | HR 75 | Temp 98.6°F | Ht 66.0 in | Wt 277.0 lb

## 2024-06-15 DIAGNOSIS — I5189 Other ill-defined heart diseases: Secondary | ICD-10-CM | POA: Diagnosis not present

## 2024-06-15 DIAGNOSIS — Z6841 Body Mass Index (BMI) 40.0 and over, adult: Secondary | ICD-10-CM

## 2024-06-15 DIAGNOSIS — R7303 Prediabetes: Secondary | ICD-10-CM | POA: Diagnosis not present

## 2024-06-15 DIAGNOSIS — F3289 Other specified depressive episodes: Secondary | ICD-10-CM

## 2024-06-15 DIAGNOSIS — I352 Nonrheumatic aortic (valve) stenosis with insufficiency: Secondary | ICD-10-CM | POA: Diagnosis not present

## 2024-06-15 DIAGNOSIS — I351 Nonrheumatic aortic (valve) insufficiency: Secondary | ICD-10-CM | POA: Insufficient documentation

## 2024-06-15 DIAGNOSIS — E669 Obesity, unspecified: Secondary | ICD-10-CM

## 2024-06-15 DIAGNOSIS — F5089 Other specified eating disorder: Secondary | ICD-10-CM | POA: Diagnosis not present

## 2024-06-15 NOTE — Progress Notes (Signed)
 Office: [phone]  /  Fax: [phone]  WEIGHT SUMMARY AND BIOMETRICS  Anthropometric Measurements Height: 5' 6 (1.676 m) Weight: 277 lb (125.6 kg) BMI (Calculated): 44.73 Weight at Last Visit: 275lb Weight Lost Since Last Visit: 0lb Weight Gained Since Last Visit: 2lb Starting Weight: 288lb Total Weight Loss (lbs): 11 lb (4.99 kg)   Body Composition  Body Fat %: 40.7 % Fat Mass (lbs): 112.8 lbs Muscle Mass (lbs): 156.4 lbs Total Body Water (lbs): 125.6 lbs Visceral Fat Rating : 29   Other Clinical Data Fasting: No Labs: no Today's Visit #: 16 Starting Date: 04/16/23    Chief Complaint: OBESITY    History of Present Illness Brian Steele is a 63 year old male with obesity who presents for obesity treatment and progress assessment.  He has been adhering to a category three eating plan approximately sixty percent of the time and engages in physical activity three days a week for sixty minutes. Despite these efforts, he has experienced a weight gain of two pounds over the past five weeks. He reports a decreased appetite, often skipping lunch due to waking up late, and typically consumes a late breakfast, skips lunch, and has an early dinner, with occasional snacking between dinner and bedtime. He does not experience significant cravings or fatigue related to his current eating plan.  He has been diagnosed with aortic valve regurgitation and diastolic dysfunction. An echocardiogram revealed a normal ejection fraction and no wall abnormalities, but confirmed grade one diastolic dysfunction and aortic valve regurgitation. He has a history of smoking and is awaiting further evaluation by a cardiologist.  He experiences shortness of breath, which began last year. Initial evaluations suggested COPD, but further investigations have considered cardiac causes. He has undergone lung workups and x-rays, and there was a possibility of undiagnosed COVID-19 contributing to his  symptoms. His breathing tests have shown improvement, leading to a reduction in inhaler use.  He is currently taking metformin  for prediabetes and Wellbutrin  for depression and emotional eating behaviors. He consumes about a gallon of water daily and has significantly reduced his coffee intake.      PHYSICAL EXAM:  Blood pressure 138/83, pulse 75, temperature 98.6 F (37 C), height 5' 6 (1.676 m), weight 277 lb (125.6 kg), SpO2 97%. Body mass index is 44.71 kg/m.  DIAGNOSTIC DATA REVIEWED:  BMET    Component Value Date/Time   NA 141 05/19/2024 0838   K 4.1 05/19/2024 0838   CL 105 05/19/2024 0838   CO2 18 (L) 05/19/2024 0838   GLUCOSE 79 05/19/2024 0838   GLUCOSE 116 (H) 03/30/2009 1255   BUN 13 05/19/2024 0838   CREATININE 1.16 05/19/2024 0838   CALCIUM 8.7 05/19/2024 0838   GFRNONAA 80 06/22/2020 1219   GFRAA 93 06/22/2020 1219   Lab Results  Component Value Date   HGBA1C 5.5 05/19/2024   HGBA1C 6.1 01/18/2021   Lab Results  Component Value Date   INSULIN  29.1 (H) 08/20/2023   INSULIN  12.5 04/16/2023   Lab Results  Component Value Date   TSH 1.700 05/19/2024   CBC    Component Value Date/Time   WBC 9.6 05/19/2024 0838   WBC 9.8 12/11/2009 1446   RBC 4.93 05/19/2024 0838   RBC 5.46 (A) 02/24/2023 0000   HGB 13.9 05/19/2024 0838   HCT 42.4 05/19/2024 0838   PLT 263 05/19/2024 0838   MCV 86 05/19/2024 0838   MCH 28.2 05/19/2024 0838   MCHC 32.8 05/19/2024 0838   MCHC  34.5 12/11/2009 1446   RDW 14.2 05/19/2024 0838   Iron Studies    Component Value Date/Time   IRON 57 10/31/2023 0837   IRON 54 02/25/2023 0000   TIBC 309 10/31/2023 0837   TIBC 405.00 02/25/2023 0000   FERRITIN 138 10/31/2023 0837   FERRITIN 69.30 02/25/2023 0000   IRONPCTSAT 18 10/31/2023 0837   Lipid Panel     Component Value Date/Time   CHOL 135 05/19/2024 0838   TRIG 165 (H) 05/19/2024 0838   HDL 42 05/19/2024 0838   CHOLHDL 3.2 05/19/2024 0838   LDLCALC 65 05/19/2024  0838   Hepatic Function Panel     Component Value Date/Time   PROT 7.0 05/19/2024 0838   ALBUMIN 4.3 05/19/2024 0838   AST 18 05/19/2024 0838   ALT 16 05/19/2024 0838   ALKPHOS 97 05/19/2024 0838   BILITOT 0.6 05/19/2024 0838   BILIDIR 0.2 03/31/2009 0500   IBILI 1.1 (H) 03/31/2009 0500      Component Value Date/Time   TSH 1.700 05/19/2024 0838   Nutritional Lab Results  Component Value Date   VD25OH 38.1 11/18/2023   VD25OH 39.5 10/31/2023   VD25OH 59.7 08/20/2023     Assessment and Plan Assessment & Plan Obesity Following category three eating plan 60% of the time and exercises three days a week for 60 minutes. Gained two pounds in the last five weeks. Concern about metabolism slowing due to inadequate nutrition, particularly protein intake. Not experiencing significant hunger or cravings but may not be consuming enough meals due to late waking hours. - Continue category three eating plan - Ensure adequate protein intake, especially if skipping meals - Consider low carb plan if interested - Encourage hydration, especially if following low carb plan - Provide another copy of the eating plan  Grade 1 diastolic dysfunction with moderate aortic valve regurgitation and aortic valve stenosis Echocardiogram shows grade 1 diastolic dysfunction and moderate aortic valve regurgitation with some aortic valve stenosis. Ejection fraction is 60-65%, with no wall abnormalities. Diastolic dysfunction likely due to uncontrolled hypertension and possibly smoking. Aortic valve regurgitation may be due to valve tethering issues. Condition is mild but requires monitoring and management to prevent progression. - Follow up with cardiology for further evaluation and management  Prediabetes Currently on metformin . Focus on diet, exercise, and weight loss to manage blood glucose levels. Emphasis on ensuring adequate nutrition to prevent metabolic slowdown. - Continue metformin  - Emphasize diet  and exercise for weight management  Emotional eating behaviors On Wellbutrin . No indication of current cravings or significant emotional eating challenges. Focus remains on maintaining mental health and managing eating behaviors. - Continue Wellbutrin   I have personally spent 48 minutes total time today in preparation, patient care, and documentation for this visit, including the following: review of clinical lab tests; review of medical tests/procedures/services and education on new diagnoses and how this relates to overall health./ Lifestyle modifications discussed in depth today     He was informed of the importance of frequent follow up visits to maximize his success with intensive lifestyle modifications for his multiple health conditions.    [name], MD

## 2024-07-05 DIAGNOSIS — E785 Hyperlipidemia, unspecified: Secondary | ICD-10-CM | POA: Diagnosis not present

## 2024-07-05 DIAGNOSIS — J441 Chronic obstructive pulmonary disease with (acute) exacerbation: Secondary | ICD-10-CM | POA: Diagnosis not present

## 2024-07-05 DIAGNOSIS — Z87891 Personal history of nicotine dependence: Secondary | ICD-10-CM | POA: Diagnosis not present

## 2024-07-05 DIAGNOSIS — R0602 Shortness of breath: Secondary | ICD-10-CM | POA: Diagnosis not present

## 2024-07-05 DIAGNOSIS — R0789 Other chest pain: Secondary | ICD-10-CM | POA: Diagnosis not present

## 2024-07-05 DIAGNOSIS — J439 Emphysema, unspecified: Secondary | ICD-10-CM | POA: Diagnosis not present

## 2024-07-05 DIAGNOSIS — R918 Other nonspecific abnormal finding of lung field: Secondary | ICD-10-CM | POA: Diagnosis not present

## 2024-07-05 DIAGNOSIS — I2510 Atherosclerotic heart disease of native coronary artery without angina pectoris: Secondary | ICD-10-CM | POA: Diagnosis not present

## 2024-07-05 DIAGNOSIS — R079 Chest pain, unspecified: Secondary | ICD-10-CM | POA: Diagnosis not present

## 2024-07-14 ENCOUNTER — Ambulatory Visit (INDEPENDENT_AMBULATORY_CARE_PROVIDER_SITE_OTHER): Admitting: Family Medicine

## 2024-07-14 ENCOUNTER — Encounter (INDEPENDENT_AMBULATORY_CARE_PROVIDER_SITE_OTHER): Payer: Self-pay | Admitting: Family Medicine

## 2024-07-14 VITALS — BP 137/80 | HR 69 | Temp 97.5°F | Ht 66.0 in | Wt 271.0 lb

## 2024-07-14 DIAGNOSIS — R7303 Prediabetes: Secondary | ICD-10-CM

## 2024-07-14 DIAGNOSIS — E785 Hyperlipidemia, unspecified: Secondary | ICD-10-CM

## 2024-07-14 DIAGNOSIS — F5089 Other specified eating disorder: Secondary | ICD-10-CM | POA: Diagnosis not present

## 2024-07-14 DIAGNOSIS — Z6841 Body Mass Index (BMI) 40.0 and over, adult: Secondary | ICD-10-CM

## 2024-07-14 DIAGNOSIS — E669 Obesity, unspecified: Secondary | ICD-10-CM

## 2024-07-14 DIAGNOSIS — F3289 Other specified depressive episodes: Secondary | ICD-10-CM

## 2024-07-14 MED ORDER — METFORMIN HCL 500 MG PO TABS
500.0000 mg | ORAL_TABLET | Freq: Two times a day (BID) | ORAL | 0 refills | Status: DC
Start: 2024-07-14 — End: 2024-09-16

## 2024-07-14 MED ORDER — BUPROPION HCL ER (SR) 150 MG PO TB12: 150.0000 mg | ORAL_TABLET | Freq: Every morning | ORAL | 0 refills | Status: DC

## 2024-07-14 NOTE — Progress Notes (Signed)
 Office: [phone]  /  Fax: [phone]  WEIGHT SUMMARY AND BIOMETRICS  Anthropometric Measurements Height: 5' 6 (1.676 m) Weight: 271 lb (122.9 kg) BMI (Calculated): 43.76 Weight at Last Visit: 6 lb Weight Lost Since Last Visit: 6 lb Weight Gained Since Last Visit: 0 Starting Weight: 288 lb Total Weight Loss (lbs): 17 lb (7.711 kg) Peak Weight: 302 lb   Body Composition  Body Fat %: 39.6 % Fat Mass (lbs): 107.4 lbs Muscle Mass (lbs): 155.8 lbs Total Body Water (lbs): 121 lbs Visceral Fat Rating : 27   Other Clinical Data Fasting: no Labs: no Today's Visit #: 17 Starting Date: 04/16/23    Chief Complaint: OBESITY    History of Present Illness Brian Steele is a 63 year old male with obesity and hyperlipidemia who presents for obesity treatment and progress assessment.  He is following a category three eating plan and engages in yard work for 60 to 90 minutes twice a week. Over the past month, he has lost six pounds. He notes a decrease in hunger and is making efforts to consume the right foods, which has reduced excessive hunger signals.  He is being treated for hyperlipidemia with simvastatin  40 mg daily. He is working on decreasing dietary cholesterol and increasing exercise to improve his cholesterol levels. His last lab results from July showed triglycerides at 165 mg/dL, which were slightly elevated despite fasting.  He reports no gastrointestinal issues with metformin , which he is currently taking. He also mentions that his potassium levels were noted to be low during a hospital visit, although they were normal in tests with his family doctor and at this clinic. He is not on any diuretics and consumes a banana daily.  His blood pressure readings vary between his right and left arms, with the right arm often showing higher readings. His first blood pressure reading today was 145/74 mmHg, but a subsequent reading on the left arm was normal at 137 mmHg.       PHYSICAL EXAM:  Blood pressure 137/80, pulse 69, temperature (!) 97.5 F (36.4 C), height 5' 6 (1.676 m), weight 271 lb (122.9 kg), SpO2 96%. Body mass index is 43.74 kg/m.  DIAGNOSTIC DATA REVIEWED:  BMET    Component Value Date/Time   NA 141 05/19/2024 0838   K 4.1 05/19/2024 0838   CL 105 05/19/2024 0838   CO2 18 (L) 05/19/2024 0838   GLUCOSE 79 05/19/2024 0838   GLUCOSE 116 (H) 03/30/2009 1255   BUN 13 05/19/2024 0838   CREATININE 1.16 05/19/2024 0838   CALCIUM 8.7 05/19/2024 0838   GFRNONAA 80 06/22/2020 1219   GFRAA 93 06/22/2020 1219   Lab Results  Component Value Date   HGBA1C 5.5 05/19/2024   HGBA1C 6.1 01/18/2021   Lab Results  Component Value Date   INSULIN  29.1 (H) 08/20/2023   INSULIN  12.5 04/16/2023   Lab Results  Component Value Date   TSH 1.700 05/19/2024   CBC    Component Value Date/Time   WBC 9.6 05/19/2024 0838   WBC 9.8 12/11/2009 1446   RBC 4.93 05/19/2024 0838   RBC 5.46 (A) 02/24/2023 0000   HGB 13.9 05/19/2024 0838   HCT 42.4 05/19/2024 0838   PLT 263 05/19/2024 0838   MCV 86 05/19/2024 0838   MCH 28.2 05/19/2024 0838   MCHC 32.8 05/19/2024 0838   MCHC 34.5 12/11/2009 1446   RDW 14.2 05/19/2024 0838   Iron Studies    Component Value Date/Time   IRON  57 10/31/2023 0837   IRON 54 02/25/2023 0000   TIBC 309 10/31/2023 0837   TIBC 405.00 02/25/2023 0000   FERRITIN 138 10/31/2023 0837   FERRITIN 69.30 02/25/2023 0000   IRONPCTSAT 18 10/31/2023 0837   Lipid Panel     Component Value Date/Time   CHOL 135 05/19/2024 0838   TRIG 165 (H) 05/19/2024 0838   HDL 42 05/19/2024 0838   CHOLHDL 3.2 05/19/2024 0838   LDLCALC 65 05/19/2024 0838   Hepatic Function Panel     Component Value Date/Time   PROT 7.0 05/19/2024 0838   ALBUMIN 4.3 05/19/2024 0838   AST 18 05/19/2024 0838   ALT 16 05/19/2024 0838   ALKPHOS 97 05/19/2024 0838   BILITOT 0.6 05/19/2024 0838   BILIDIR 0.2 03/31/2009 0500   IBILI 1.1 (H) 03/31/2009 0500       Component Value Date/Time   TSH 1.700 05/19/2024 0838   Nutritional Lab Results  Component Value Date   VD25OH 38.1 11/18/2023   VD25OH 39.5 10/31/2023   VD25OH 59.7 08/20/2023     Assessment and Plan Assessment & Plan Obesity Obesity management is ongoing with a category three eating plan and increased physical activity, including yard work twice a week for 60-90 minutes. Weight loss of six pounds in the last month indicates progress. Hunger signals have decreased, suggesting improved dietary habits. Visceral fat has decreased, indicating a reduction in the most dangerous type of fat. - Continue category three eating plan - Encourage regular physical activity, including yard work - Refill bupropion  and metformin   Hyperlipidemia Hyperlipidemia is being managed with simvastatin  40 mg daily. Recent labs showed triglycerides at 165 mg/dL, slightly above the target of 150 mg/dL, despite fasting. This suggests dietary carbohydrates may have been higher prior to testing. - Continue simvastatin  40 mg daily - Monitor triglyceride levels in next lab work - [employer] modifications to reduce carbohydrate intake  Prediabetes Prediabetes is being managed with metformin  and lifestyle modifications. No gastrointestinal issues reported with metformin  use. Dietary advice includes reducing high-sugar fruits like bananas, grapes, and cherries, and increasing intake of beans for better potassium levels. - Continue metformin  - Advise on dietary modifications, including reducing high-sugar fruits and increasing bean intake (for potassium)  Emotional Eating Behaviors EEB management includes bupropion , which appears to be effective as emotional eating and cravings have improved. - Continue bupropion  - Monitor emotional eating and cravings    He was informed of the importance of frequent follow up visits to maximize his success with intensive lifestyle modifications for his multiple  health conditions.    [name], MD

## 2024-07-29 ENCOUNTER — Other Ambulatory Visit: Payer: Self-pay | Admitting: Family Medicine

## 2024-07-29 DIAGNOSIS — J029 Acute pharyngitis, unspecified: Secondary | ICD-10-CM

## 2024-08-10 ENCOUNTER — Encounter: Payer: Self-pay | Admitting: Cardiology

## 2024-08-10 ENCOUNTER — Ambulatory Visit: Attending: Cardiology | Admitting: Cardiology

## 2024-08-10 VITALS — BP 118/70 | HR 81 | Ht 67.0 in | Wt 276.2 lb

## 2024-08-10 DIAGNOSIS — I351 Nonrheumatic aortic (valve) insufficiency: Secondary | ICD-10-CM

## 2024-08-10 DIAGNOSIS — I350 Nonrheumatic aortic (valve) stenosis: Secondary | ICD-10-CM

## 2024-08-10 DIAGNOSIS — E782 Mixed hyperlipidemia: Secondary | ICD-10-CM

## 2024-08-10 DIAGNOSIS — I700 Atherosclerosis of aorta: Secondary | ICD-10-CM | POA: Diagnosis not present

## 2024-08-10 NOTE — Progress Notes (Signed)
 Clinical Summary Mr. Prosch is a 63 y.o.male seen as a new consult, referred by NP Rakes for the following medical problems.   1.Aortic stenosis - 05/2024 echo: LVEF 60-65%, no WMAs, grade I dd, normal RV function, mod AI, mild AS mean grad 9 AVA VTI 1.93, ascending aorta 39 mmg.    2.Chest pain - ER visit 06/2024 at [facility] - Trops neg, CT PE negative - 2024 coronary CTA: calcium score 79.5, minimal to mild CAD. Ascending aorta 40 mm.  3. SOB - followed by pulmonary at [facility]   4. Coronary atherosclerosis - 2024 coronary CTA: calcium score 79.5, minimal to mild CAD. Ascending aorta 40 mm.  - he on statin, ASA  5. Prediabetes  6. HLD - 05/2024 TC 135 TG 165 HDL 42 LDL 65  7. OSA Past Medical History:  Diagnosis Date   Allergy    Aortic atherosclerosis 01/12/2022   Arthritis    Back pain    Barrett's esophagus    Bilateral swelling of feet    Chest pain    COPD (chronic obstructive pulmonary disease) (HCC)    Gallbladder problem    GERD (gastroesophageal reflux disease)    Hyperlipidemia    Obstructive sleep apnea    Prediabetes    Sleep apnea    SOB (shortness of breath)    Vitamin D  deficiency      Allergies  Allergen Reactions   Bee Venom Swelling   Other Hives    In laundry detergents   In laundry detergents   Sulfa Antibiotics Swelling   Sodium Hypochlorite Rash     Current Outpatient Medications  Medication Sig Dispense Refill   albuterol  (VENTOLIN  HFA) 108 (90 Base) MCG/ACT inhaler TAKE 2 PUFFS BY MOUTH EVERY 6 HOURS AS NEEDED FOR WHEEZE OR SHORTNESS OF BREATH 18 each 4   aspirin 81 MG EC tablet Take 1 tablet by mouth daily.     buPROPion  (WELLBUTRIN  SR) 150 MG 12 hr tablet Take 1 tablet (150 mg total) by mouth every morning. 90 tablet 0   fluticasone  (FLONASE ) 50 MCG/ACT nasal spray Place 2 sprays into both nostrils daily. 16 g 6   HYDROcodone-acetaminophen  (NORCO/VICODIN) 5-325 MG tablet Take 1 tablet by mouth 2 (two) times daily.      levocetirizine (XYZAL ) 5 MG tablet TAKE 1 TABLET BY MOUTH EVERY DAY IN THE EVENING 90 tablet 1   metFORMIN  (GLUCOPHAGE ) 500 MG tablet Take 1 tablet (500 mg total) by mouth 2 (two) times daily with a meal. 180 tablet 0   naloxone (NARCAN) nasal spray 4 mg/0.1 mL Place into the nose.     omeprazole  (PRILOSEC) 20 MG capsule TAKE 1 CAPSULE BY MOUTH EVERY DAY 90 capsule 1   simvastatin  (ZOCOR ) 40 MG tablet Take 1 tablet (40 mg total) by mouth daily. 90 tablet 3   tiZANidine (ZANAFLEX) 4 MG tablet Take 4 mg by mouth at bedtime as needed.     No current facility-administered medications for this visit.     Past Surgical History:  Procedure Laterality Date   APPENDECTOMY     CARPAL TUNNEL RELEASE     CHOLECYSTECTOMY  2013   KNEE ARTHROSCOPY     meniscal repair   NECK SURGERY     has plates   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   SHOULDER SURGERY Bilateral      Allergies  Allergen Reactions   Bee Venom Swelling   Other Hives    In laundry detergents  In laundry detergents   Sulfa Antibiotics Swelling   Sodium Hypochlorite Rash      Family History  Problem Relation Age of Onset   Arthritis/Rheumatoid Mother    Cancer Mother    Diabetes Mother    Cancer Father    Diabetes Father    Kidney cancer Father    Obesity Father    Hypertension Brother    Hyperlipidemia Brother    Obesity Daughter      Social History Mr. Demarest reports that he quit smoking about 7 years ago. His smoking use included cigarettes. He started smoking about 33 years ago. He has a 52 pack-year smoking history. He has never used smokeless tobacco. Mr. Rho reports that he does not currently use alcohol.     Physical Examination Today's Vitals   08/10/24 0851  BP: 118/70  Pulse: 81  SpO2: 95%  Weight: 276 lb 3.2 oz (125.3 kg)  Height: 5' 7 (1.702 m)   Body mass index is 43.26 kg/m.  Gen: resting comfortably, no acute distress HEENT: no scleral icterus, pupils equal round and  reactive, no palptable cervical adenopathy,  CV: RRR, no mrg, no jvd Resp: Clear to auscultation bilaterally GI: abdomen is soft, non-tender, non-distended, normal bowel sounds, no hepatosplenomegaly MSK: extremities are warm, no edema.  Skin: warm, no rash Neuro:  no focal deficits Psych: appropriate affect    Assessment and Plan  1.Aortic stenosis/Aortic regurgitation - mild AS, mod AI. Neither is to degree that would be symptomatic at this point - continue to monitor, repeat echo 2 years  2. Coronary atherosclerosis - minimal to mild CAD by CTA last year - continue ASA/statin, risk factor modification  3. HLD - at goal, continue current meds  4. DOE - fairly benign echo and coronary CTA findings. There is nothing cardiac wise that clearly explains his symptoms - f/u with pulmonary , if no clear pulmonary etiology may be related to severe obesity and deconditioning. We discussed increased exercise, weight loss  5. Mildly dilated ascending aorta - continue to monitor, plan for repeat echo as described above.      Dorn PHEBE Ross, M.D.

## 2024-08-10 NOTE — Patient Instructions (Signed)
 Medication Instructions:  Continue all current medications.   Labwork: none  Testing/Procedures: none  Follow-Up: 6 months   Any Other Special Instructions Will Be Listed Below (If Applicable).   If you need a refill on your cardiac medications before your next appointment, please call your pharmacy.

## 2024-08-11 ENCOUNTER — Ambulatory Visit (INDEPENDENT_AMBULATORY_CARE_PROVIDER_SITE_OTHER): Admitting: Family Medicine

## 2024-08-11 ENCOUNTER — Encounter (INDEPENDENT_AMBULATORY_CARE_PROVIDER_SITE_OTHER): Payer: Self-pay

## 2024-08-11 DIAGNOSIS — J449 Chronic obstructive pulmonary disease, unspecified: Secondary | ICD-10-CM | POA: Diagnosis not present

## 2024-08-11 DIAGNOSIS — Z87891 Personal history of nicotine dependence: Secondary | ICD-10-CM | POA: Diagnosis not present

## 2024-08-11 DIAGNOSIS — Z23 Encounter for immunization: Secondary | ICD-10-CM | POA: Diagnosis not present

## 2024-08-12 DIAGNOSIS — J449 Chronic obstructive pulmonary disease, unspecified: Secondary | ICD-10-CM | POA: Diagnosis not present

## 2024-08-12 DIAGNOSIS — G4733 Obstructive sleep apnea (adult) (pediatric): Secondary | ICD-10-CM | POA: Diagnosis not present

## 2024-08-19 ENCOUNTER — Encounter: Payer: Self-pay | Admitting: *Deleted

## 2024-09-01 DIAGNOSIS — Z87891 Personal history of nicotine dependence: Secondary | ICD-10-CM | POA: Diagnosis not present

## 2024-09-02 ENCOUNTER — Encounter: Payer: Self-pay | Admitting: *Deleted

## 2024-09-03 ENCOUNTER — Encounter: Payer: Self-pay | Admitting: *Deleted

## 2024-09-08 DIAGNOSIS — J449 Chronic obstructive pulmonary disease, unspecified: Secondary | ICD-10-CM | POA: Diagnosis not present

## 2024-09-16 ENCOUNTER — Encounter (INDEPENDENT_AMBULATORY_CARE_PROVIDER_SITE_OTHER): Payer: Self-pay | Admitting: Family Medicine

## 2024-09-16 ENCOUNTER — Ambulatory Visit (INDEPENDENT_AMBULATORY_CARE_PROVIDER_SITE_OTHER): Payer: Self-pay | Admitting: Family Medicine

## 2024-09-16 VITALS — BP 143/83 | HR 67 | Temp 97.6°F | Ht 66.0 in | Wt 271.0 lb

## 2024-09-16 DIAGNOSIS — E538 Deficiency of other specified B group vitamins: Secondary | ICD-10-CM

## 2024-09-16 DIAGNOSIS — R030 Elevated blood-pressure reading, without diagnosis of hypertension: Secondary | ICD-10-CM | POA: Diagnosis not present

## 2024-09-16 DIAGNOSIS — E669 Obesity, unspecified: Secondary | ICD-10-CM

## 2024-09-16 DIAGNOSIS — R7303 Prediabetes: Secondary | ICD-10-CM

## 2024-09-16 DIAGNOSIS — E559 Vitamin D deficiency, unspecified: Secondary | ICD-10-CM

## 2024-09-16 DIAGNOSIS — K760 Fatty (change of) liver, not elsewhere classified: Secondary | ICD-10-CM | POA: Diagnosis not present

## 2024-09-16 DIAGNOSIS — E782 Mixed hyperlipidemia: Secondary | ICD-10-CM

## 2024-09-16 DIAGNOSIS — Z6841 Body Mass Index (BMI) 40.0 and over, adult: Secondary | ICD-10-CM

## 2024-09-16 DIAGNOSIS — F3289 Other specified depressive episodes: Secondary | ICD-10-CM

## 2024-09-16 MED ORDER — METFORMIN HCL 500 MG PO TABS: 500.0000 mg | ORAL_TABLET | Freq: Two times a day (BID) | ORAL | 0 refills | Status: DC

## 2024-09-16 MED ORDER — BUPROPION HCL ER (SR) 150 MG PO TB12: 150.0000 mg | ORAL_TABLET | Freq: Every morning | ORAL | 0 refills | Status: DC

## 2024-09-16 NOTE — Progress Notes (Signed)
 Office: [phone]  /  Fax: [phone]  WEIGHT SUMMARY AND BIOMETRICS  Anthropometric Measurements Height: 5' 6 (1.676 m) Weight: 271 lb (122.9 kg) BMI (Calculated): 43.76 Weight at Last Visit: 271 lb Weight Lost Since Last Visit: 0 Weight Gained Since Last Visit: 0 Starting Weight: 288 lb Total Weight Loss (lbs): 17 lb (7.711 kg) Peak Weight: 302 lb   Body Composition  Body Fat %: 40 % Fat Mass (lbs): 108.4 lbs Muscle Mass (lbs): 154.6 lbs Total Body Water (lbs): 119.6 lbs Visceral Fat Rating : 28   Other Clinical Data Fasting: yes Labs: no Starting Date: 04/16/23    Chief Complaint: OBESITY    History of Present Illness Brian Steele is a 63 year old male with obesity and prediabetes who presents for obesity treatment and progress assessment.  He is following the category three eating plan about half the time, attempting to consume the recommended amount of protein and hydrate adequately. However, he skips meals and does not consistently eat enough fruits or vegetables. For exercise, he engages in yard work for 60 minutes, several days a week. Despite these efforts, he has maintained his weight over the last two months since his last visit.  He is currently being treated for prediabetes with metformin , taking 500 mg twice daily. His last A1c was 5.5, and he is due for a repeat test. He is also on a medication referred to as a 'purple pill,' which he is running low on and needs a refill. This is likely Wellbutrin  which is being prescribed for his emotional eating behaviors.  His blood pressure was elevated today. He denies any recent intake of coffee, which he thought might affect his blood pressure, and attributes potential elevation to stress and 'idiot drivers on the road.' No abdominal pain or any unusual symptoms related to his elevated blood pressure.  He has a history of gallbladder issues, having had his gallbladder removed after it was found to be  severely inflamed. He recalls experiencing a 'slight, throbbing, sharp pain' that led to a fetal position, similar to when his appendix was removed.       PHYSICAL EXAM:  Blood pressure (!) 143/83, pulse 67, temperature 97.6 F (36.4 C), height 5' 6 (1.676 m), weight 271 lb (122.9 kg), SpO2 95%. Body mass index is 43.74 kg/m.  DIAGNOSTIC DATA REVIEWED:  BMET    Component Value Date/Time   NA 141 05/19/2024 0838   K 4.1 05/19/2024 0838   CL 105 05/19/2024 0838   CO2 18 (L) 05/19/2024 0838   GLUCOSE 79 05/19/2024 0838   GLUCOSE 116 (H) 03/30/2009 1255   BUN 13 05/19/2024 0838   CREATININE 1.16 05/19/2024 0838   CALCIUM 8.7 05/19/2024 0838   GFRNONAA 80 06/22/2020 1219   GFRAA 93 06/22/2020 1219   Lab Results  Component Value Date   HGBA1C 5.5 05/19/2024   HGBA1C 6.1 01/18/2021   Lab Results  Component Value Date   INSULIN  29.1 (H) 08/20/2023   INSULIN  12.5 04/16/2023   Lab Results  Component Value Date   TSH 1.700 05/19/2024   CBC    Component Value Date/Time   WBC 9.6 05/19/2024 0838   WBC 9.8 12/11/2009 1446   RBC 4.93 05/19/2024 0838   RBC 5.46 (A) 02/24/2023 0000   HGB 13.9 05/19/2024 0838   HCT 42.4 05/19/2024 0838   PLT 263 05/19/2024 0838   MCV 86 05/19/2024 0838   MCH 28.2 05/19/2024 0838   MCHC 32.8 05/19/2024 0838  MCHC 34.5 12/11/2009 1446   RDW 14.2 05/19/2024 0838   Iron Studies    Component Value Date/Time   IRON 57 10/31/2023 0837   IRON 54 02/25/2023 0000   TIBC 309 10/31/2023 0837   TIBC 405.00 02/25/2023 0000   FERRITIN 138 10/31/2023 0837   FERRITIN 69.30 02/25/2023 0000   IRONPCTSAT 18 10/31/2023 0837   Lipid Panel     Component Value Date/Time   CHOL 135 05/19/2024 0838   TRIG 165 (H) 05/19/2024 0838   HDL 42 05/19/2024 0838   CHOLHDL 3.2 05/19/2024 0838   LDLCALC 65 05/19/2024 0838   Hepatic Function Panel     Component Value Date/Time   PROT 7.0 05/19/2024 0838   ALBUMIN 4.3 05/19/2024 0838   AST 18 05/19/2024  0838   ALT 16 05/19/2024 0838   ALKPHOS 97 05/19/2024 0838   BILITOT 0.6 05/19/2024 0838   BILIDIR 0.2 03/31/2009 0500   IBILI 1.1 (H) 03/31/2009 0500      Component Value Date/Time   TSH 1.700 05/19/2024 0838   Nutritional Lab Results  Component Value Date   VD25OH 38.1 11/18/2023   VD25OH 39.5 10/31/2023   VD25OH 59.7 08/20/2023     Assessment and Plan Assessment & Plan Obesity Weight has been maintained over the last two months. Following the category three eating plan about half the time, attempting to increase protein intake, and hydrate adequately. Skipping meals and not consistently consuming fruits or vegetables. Engaging in yard work as exercise, 60 minutes per day, 5 days a week. Stress related to upcoming liver biopsy may be impacting weight management efforts. - Continue category three eating plan - Encouraged increased protein intake and adequate hydration - holiday eating strategies discussed  Prediabetes Currently managed with metformin  500 mg twice daily. Last A1c was 5.5, indicating good control. - Continue metformin  500 mg twice daily - Ordered repeat A1c and insulin  levels - Continue diet, exercise and weight loss as discussed today as an important part of the treatment plan   Elevated blood pressure Blood pressure readings today were 147/79 and 143/83, which are elevated. Possible temporary elevation due to stress or lack of caffeine intake prior to blood work. - Continue to monitor blood pressure - Continue diet, exercise and weight loss as discussed today as an important part of the treatment plan - Recheck BP in 4 weeks   Fatty liver disease Suspected fatty liver disease, likely related to obesity. No current abdominal pain reported. Previous gallbladder issues noted. Liver enzymes have been stable. Weight loss is a primary treatment strategy. - Continue weight loss efforts - Ordered liver function tests  Vitamin D  deficiency Vitamin D  levels to  be checked as part of routine monitoring. - Ordered vitamin D  level  Vitamin B12 deficiency, borderline Previous B12 levels were borderline low. B12 is important for red blood cell production and metabolism. - Ordered vitamin B12 level      Courage was counseled on the importance of maintaining healthy lifestyle habits, including balanced nutrition, regular physical activity, and behavioral modifications, while taking antiobesity medication.  Patient verbalized understanding that medication is an adjunct to, not a replacement for, lifestyle changes and that the long-term success and weight maintenance depend on continued adherence to these strategies.   Swan was informed of the importance of frequent follow up visits to maximize his success with intensive lifestyle modifications for his obesity and obesity related health conditions as recommended by USPSTF and CMS guidelines   [name], MD

## 2024-09-17 LAB — VITAMIN B12: Vitamin B-12: 430 pg/mL (ref 232–1245)

## 2024-09-17 LAB — CBC WITH DIFFERENTIAL/PLATELET
Basophils Absolute: 0.1 x10E3/uL (ref 0.0–0.2)
Basos: 1 %
EOS (ABSOLUTE): 0.5 x10E3/uL — ABNORMAL HIGH (ref 0.0–0.4)
Eos: 6 %
Hematocrit: 44.4 % (ref 37.5–51.0)
Hemoglobin: 14.8 g/dL (ref 13.0–17.7)
Immature Grans (Abs): 0 x10E3/uL (ref 0.0–0.1)
Immature Granulocytes: 0 %
Lymphocytes Absolute: 2.6 x10E3/uL (ref 0.7–3.1)
Lymphs: 27 %
MCH: 28.8 pg (ref 26.6–33.0)
MCHC: 33.3 g/dL (ref 31.5–35.7)
MCV: 86 fL (ref 79–97)
Monocytes Absolute: 0.7 x10E3/uL (ref 0.1–0.9)
Monocytes: 7 %
Neutrophils Absolute: 5.6 x10E3/uL (ref 1.4–7.0)
Neutrophils: 59 %
Platelets: 315 x10E3/uL (ref 150–450)
RBC: 5.14 x10E6/uL (ref 4.14–5.80)
RDW: 13.8 % (ref 11.6–15.4)
WBC: 9.6 x10E3/uL (ref 3.4–10.8)

## 2024-09-17 LAB — CMP14+EGFR
ALT: 19 IU/L (ref 0–44)
AST: 20 IU/L (ref 0–40)
Albumin: 4.2 g/dL (ref 3.9–4.9)
Alkaline Phosphatase: 96 IU/L (ref 47–123)
BUN/Creatinine Ratio: 17 (ref 10–24)
BUN: 17 mg/dL (ref 8–27)
Bilirubin Total: 0.8 mg/dL (ref 0.0–1.2)
CO2: 21 mmol/L (ref 20–29)
Calcium: 9.3 mg/dL (ref 8.6–10.2)
Chloride: 104 mmol/L (ref 96–106)
Creatinine, Ser: 1.01 mg/dL (ref 0.76–1.27)
Globulin, Total: 3.1 g/dL (ref 1.5–4.5)
Glucose: 86 mg/dL (ref 70–99)
Potassium: 4.3 mmol/L (ref 3.5–5.2)
Sodium: 140 mmol/L (ref 134–144)
Total Protein: 7.3 g/dL (ref 6.0–8.5)
eGFR: 84 mL/min/1.73 (ref >59)

## 2024-09-17 LAB — VITAMIN D 25 HYDROXY (VIT D DEFICIENCY, FRACTURES): Vit D, 25-Hydroxy: 32.2 ng/mL (ref 30.0–100.0)

## 2024-09-17 LAB — LIPID PANEL WITH LDL/HDL RATIO
Cholesterol, Total: 159 mg/dL (ref 100–199)
HDL: 50 mg/dL (ref >39)
LDL Chol Calc (NIH): 84 mg/dL (ref 0–99)
LDL/HDL Ratio: 1.7 ratio (ref 0.0–3.6)
Triglycerides: 142 mg/dL (ref 0–149)
VLDL Cholesterol Cal: 25 mg/dL (ref 5–40)

## 2024-09-17 LAB — INSULIN, RANDOM: INSULIN: 21.5 u[IU]/mL (ref 2.6–24.9)

## 2024-09-17 LAB — HEMOGLOBIN A1C
Est. average glucose Bld gHb Est-mCnc: 108 mg/dL
Hgb A1c MFr Bld: 5.4 % (ref 4.8–5.6)

## 2024-10-14 ENCOUNTER — Ambulatory Visit (INDEPENDENT_AMBULATORY_CARE_PROVIDER_SITE_OTHER): Admitting: Family Medicine

## 2024-10-14 ENCOUNTER — Encounter (INDEPENDENT_AMBULATORY_CARE_PROVIDER_SITE_OTHER): Payer: Self-pay | Admitting: Family Medicine

## 2024-10-14 VITALS — BP 129/74 | HR 60 | Temp 97.4°F | Ht 66.0 in | Wt 268.0 lb

## 2024-10-14 DIAGNOSIS — E559 Vitamin D deficiency, unspecified: Secondary | ICD-10-CM | POA: Diagnosis not present

## 2024-10-14 DIAGNOSIS — Z6841 Body Mass Index (BMI) 40.0 and over, adult: Secondary | ICD-10-CM | POA: Diagnosis not present

## 2024-10-14 DIAGNOSIS — E669 Obesity, unspecified: Secondary | ICD-10-CM

## 2024-10-14 DIAGNOSIS — R7303 Prediabetes: Secondary | ICD-10-CM

## 2024-10-14 DIAGNOSIS — F5089 Other specified eating disorder: Secondary | ICD-10-CM | POA: Diagnosis not present

## 2024-10-14 DIAGNOSIS — F3289 Other specified depressive episodes: Secondary | ICD-10-CM

## 2024-10-14 MED ORDER — METFORMIN HCL 500 MG PO TABS: 500.0000 mg | ORAL_TABLET | Freq: Two times a day (BID) | ORAL | 0 refills | Status: AC

## 2024-10-14 MED ORDER — VITAMIN D (ERGOCALCIFEROL) 1.25 MG (50000 UNIT) PO CAPS: 50000.0000 [IU] | ORAL_CAPSULE | ORAL | 0 refills | Status: DC

## 2024-10-14 MED ORDER — BUPROPION HCL ER (SR) 150 MG PO TB12: 150.0000 mg | ORAL_TABLET | Freq: Every morning | ORAL | 0 refills | Status: DC

## 2024-10-14 NOTE — Progress Notes (Signed)
 Office: [phone]  /  Fax: [phone]  WEIGHT SUMMARY AND BIOMETRICS  Anthropometric Measurements Height: 5' 6 (1.676 m) Weight: 268 lb (121.6 kg) BMI (Calculated): 43.28 Weight at Last Visit: 271 lb Weight Lost Since Last Visit: 3 lb Weight Gained Since Last Visit: 0 Starting Weight: 288 lb Total Weight Loss (lbs): 20 lb (9.072 kg) Peak Weight: 302 lb   Body Composition  Body Fat %: 39.7 % Fat Mass (lbs): 106.4 lbs Muscle Mass (lbs): 153.8 lbs Total Body Water (lbs): 120.8 lbs Visceral Fat Rating : 27   Other Clinical Data Fasting: no Labs: no Today's Visit #: 19 Starting Date: 04/16/23    Chief Complaint: OBESITY    History of Present Illness Brian Steele is a 63 year old male with obesity and prediabetes who presents for obesity treatment and progress assessment.  He is following the category three eating plan about fifty percent of the time, focusing on increasing vegetable intake and hydration. He is not consuming the recommended amount of protein and is skipping meals. Despite not exercising, he has lost three pounds in the last month.  He is being treated for prediabetes with metformin  500 mg twice a day. Recent blood work shows a hemoglobin A1c of 5.4 and an insulin  level that has improved from 29 to 21.  He is addressing emotional eating behaviors with Wellbutrin  SR 150 mg in the morning. No problems with his medications and no changes in his health since the last visit.  Recent blood work shows triglycerides at 142 and LDL above goal. He is not currently taking vitamin D , and his levels have fallen to the thirties. His B12 levels have dropped slightly.  He traveled with his wife for Thanksgiving, which involved an 11 to 12-hour drive. He did not eat breakfast or lunch during the trip, primarily consuming dinner. He plans to consume more protein, particularly from a 20-pound turkey he has at home. No new health changes since the last visit.       PHYSICAL EXAM:  Blood pressure 129/74, pulse 60, temperature (!) 97.4 F (36.3 C), height 5' 6 (1.676 m), weight 268 lb (121.6 kg), SpO2 97%. Body mass index is 43.26 kg/m.  DIAGNOSTIC DATA REVIEWED:  BMET    Component Value Date/Time   NA 140 09/16/2024 1233   K 4.3 09/16/2024 1233   CL 104 09/16/2024 1233   CO2 21 09/16/2024 1233   GLUCOSE 86 09/16/2024 1233   GLUCOSE 116 (H) 03/30/2009 1255   BUN 17 09/16/2024 1233   CREATININE 1.01 09/16/2024 1233   CALCIUM 9.3 09/16/2024 1233   GFRNONAA 80 06/22/2020 1219   GFRAA 93 06/22/2020 1219   Lab Results  Component Value Date   HGBA1C 5.4 09/16/2024   HGBA1C 6.1 01/18/2021   Lab Results  Component Value Date   INSULIN  21.5 09/16/2024   INSULIN  12.5 04/16/2023   Lab Results  Component Value Date   TSH 1.700 05/19/2024   CBC    Component Value Date/Time   WBC 9.6 09/16/2024 1233   WBC 9.8 12/11/2009 1446   RBC 5.14 09/16/2024 1233   RBC 5.46 (A) 02/24/2023 0000   HGB 14.8 09/16/2024 1233   HCT 44.4 09/16/2024 1233   PLT 315 09/16/2024 1233   MCV 86 09/16/2024 1233   MCH 28.8 09/16/2024 1233   MCHC 33.3 09/16/2024 1233   MCHC 34.5 12/11/2009 1446   RDW 13.8 09/16/2024 1233   Iron Studies    Component Value Date/Time  IRON 57 10/31/2023 0837   IRON 54 02/25/2023 0000   TIBC 309 10/31/2023 0837   TIBC 405.00 02/25/2023 0000   FERRITIN 138 10/31/2023 0837   FERRITIN 69.30 02/25/2023 0000   IRONPCTSAT 18 10/31/2023 0837   Lipid Panel     Component Value Date/Time   CHOL 159 09/16/2024 1233   TRIG 142 09/16/2024 1233   HDL 50 09/16/2024 1233   CHOLHDL 3.2 05/19/2024 0838   LDLCALC 84 09/16/2024 1233   Hepatic Function Panel     Component Value Date/Time   PROT 7.3 09/16/2024 1233   ALBUMIN 4.2 09/16/2024 1233   AST 20 09/16/2024 1233   ALT 19 09/16/2024 1233   ALKPHOS 96 09/16/2024 1233   BILITOT 0.8 09/16/2024 1233   BILIDIR 0.2 03/31/2009 0500   IBILI 1.1 (H) 03/31/2009 0500       Component Value Date/Time   TSH 1.700 05/19/2024 0838   Nutritional Lab Results  Component Value Date   VD25OH 32.2 09/16/2024   VD25OH 38.1 11/18/2023   VD25OH 39.5 10/31/2023     Assessment and Plan Assessment & Plan Obesity Management is ongoing with a focus on dietary modifications. He has lost three pounds in the last month, which is commendable given the recent Thanksgiving holiday. He is following the category three eating plan about 50% of the time, increasing vegetable intake, and attempting adequate hydration. However, he is not meeting the recommended protein intake and is skipping meals, which could negatively impact long-term weight loss. He is not currently exercising. - Continue category three eating plan - Encouraged increased protein intake - Advised against skipping meals  Prediabetes Managed with metformin  500 mg twice daily. Recent blood work shows a hemoglobin A1c of 5.4, indicating good control. Insulin  levels have improved from 29 to 21, showing progress towards the goal of below 10. Weight loss and dietary changes are contributing positively to his condition. - Continue metformin  500 mg twice daily - Refilled metformin  prescription  Vitamin D  deficiency Vitamin D  levels have decreased, likely due to seasonal changes. Current levels are in the 30s, below the desired range of 50s or 60s. Low vitamin D  can lead to fatigue, sluggishness, and increased risk of fragile bones. - Prescribed prescription vitamin D  once weekly - Sent prescription to pharmacy  Emotional eating behaviors Managed with Wellbutrin  SR 150 mg in the morning. He is working on maintaining a balanced diet and managing emotional eating, especially during the upcoming Christmas holiday. - Continue Wellbutrin  SR 150 mg in the morning - Refilled Wellbutrin  prescription      Patients who are on anti-obesity medications are counseled on the importance of maintaining healthy lifestyle habits,  including balanced nutrition, regular physical activity, and behavioral modifications,  Medication is an adjunct to, not a replacement for, lifestyle changes and that the long-term success and weight maintenance depend on continued adherence to these strategies.   Brian Steele was informed of the importance of frequent follow up visits to maximize his success with intensive lifestyle modifications for his obesity and obesity related health conditions as recommended by USPSTF and CMS guidelines   [name], MD

## 2024-11-17 ENCOUNTER — Ambulatory Visit (INDEPENDENT_AMBULATORY_CARE_PROVIDER_SITE_OTHER): Admitting: Family Medicine

## 2024-12-01 ENCOUNTER — Encounter (INDEPENDENT_AMBULATORY_CARE_PROVIDER_SITE_OTHER): Payer: Self-pay | Admitting: Family Medicine

## 2024-12-01 ENCOUNTER — Telehealth (INDEPENDENT_AMBULATORY_CARE_PROVIDER_SITE_OTHER): Admitting: Family Medicine

## 2024-12-01 VITALS — Ht 66.0 in | Wt 268.0 lb

## 2024-12-01 DIAGNOSIS — E559 Vitamin D deficiency, unspecified: Secondary | ICD-10-CM | POA: Diagnosis not present

## 2024-12-01 DIAGNOSIS — R7303 Prediabetes: Secondary | ICD-10-CM

## 2024-12-01 DIAGNOSIS — Z6841 Body Mass Index (BMI) 40.0 and over, adult: Secondary | ICD-10-CM | POA: Diagnosis not present

## 2024-12-01 MED ORDER — VITAMIN D (ERGOCALCIFEROL) 1.25 MG (50000 UNIT) PO CAPS: 50000.0000 [IU] | ORAL_CAPSULE | ORAL | 0 refills | Status: DC

## 2024-12-01 NOTE — Progress Notes (Signed)
 "  Office: [phone]  /  Fax: [phone]  WEIGHT SUMMARY AND BIOMETRICS  Anthropometric Measurements Height: 5' 6 (1.676 m) Weight: 268 lb (121.6 kg) (last visit) BMI (Calculated): 43.28 Weight at Last Visit: 268 lb Weight Lost Since Last Visit: 0 Weight Gained Since Last Visit: 0 Starting Weight: 288 lb Total Weight Loss (lbs): 20 lb (9.072 kg) Peak Weight: 302 lb   No data recorded Other Clinical Data Fasting: no Labs: no Today's Visit #: 20 Starting Date: 04/16/23 Comments: my chart video visit    Chief Complaint: OBESITY  Virtual Visit via A/V Note  I connected with Brian Steele on 12/01/24 at 11:20 AM EST by audiovisual telehealth and verified that I am speaking with the correct person using two identifiers.  Location: Patient: home Provider: home   I discussed the limitations, risks, security and privacy concerns of performing an evaluation and management service by AV telehealth and the availability of in person appointments. I also discussed with the patient that there may be a patient responsible charge related to this service. The patient expressed understanding and agreed to proceed.    History of Present Illness Brian Steele is a 64 year old male with obesity and prediabetes who presents for obesity treatment and progress assessment. He is accompanied by his caregiver, who manages his medications.  He is following a category three eating plan approximately 75% of the time and has lost weight, currently weighing 272 pounds. He is not exercising due to a cold but is working on consuming the recommended amount of protein and hydrating adequately, although he is skipping meals.  He takes prescription ergocalciferol  50,000 IU per week for vitamin D  deficiency and requests a refill of this medication.  He has a cold with symptoms that come and go, including sneezing and intermittent chest congestion. He uses a CPAP machine every night and is breathing  comfortably. He has a cough but not frequently, and he sneezes a lot. No significant fever, chest tightness, or difficulty breathing.  He is on metformin  for prediabetes and reports occasional indulgence in [employer] cookies. He experienced a low blood sugar episode when his glucose level dropped to 61, which was quickly resolved by consuming soda. He missed some doses of metformin  due to running out of medication but has since resumed his regular dosing.      PHYSICAL EXAM:  Height 5' 6 (1.676 m), weight 268 lb (121.6 kg). Body mass index is 43.26 kg/m.  DIAGNOSTIC DATA REVIEWED BY MYSELF TODAY:  BMET    Component Value Date/Time   NA 140 09/16/2024 1233   K 4.3 09/16/2024 1233   CL 104 09/16/2024 1233   CO2 21 09/16/2024 1233   GLUCOSE 86 09/16/2024 1233   GLUCOSE 116 (H) 03/30/2009 1255   BUN 17 09/16/2024 1233   CREATININE 1.01 09/16/2024 1233   CALCIUM 9.3 09/16/2024 1233   GFRNONAA 80 06/22/2020 1219   GFRAA 93 06/22/2020 1219   Lab Results  Component Value Date   HGBA1C 5.4 09/16/2024   HGBA1C 6.1 01/18/2021   Lab Results  Component Value Date   INSULIN  21.5 09/16/2024   INSULIN  12.5 04/16/2023   Lab Results  Component Value Date   TSH 1.700 05/19/2024   CBC    Component Value Date/Time   WBC 9.6 09/16/2024 1233   WBC 9.8 12/11/2009 1446   RBC 5.14 09/16/2024 1233   RBC 5.46 (A) 02/24/2023 0000   HGB 14.8 09/16/2024 1233   HCT 44.4  09/16/2024 1233   PLT 315 09/16/2024 1233   MCV 86 09/16/2024 1233   MCH 28.8 09/16/2024 1233   MCHC 33.3 09/16/2024 1233   MCHC 34.5 12/11/2009 1446   RDW 13.8 09/16/2024 1233   Iron Studies    Component Value Date/Time   IRON 57 10/31/2023 0837   IRON 54 02/25/2023 0000   TIBC 309 10/31/2023 0837   TIBC 405.00 02/25/2023 0000   FERRITIN 138 10/31/2023 0837   FERRITIN 69.30 02/25/2023 0000   IRONPCTSAT 18 10/31/2023 0837   Lipid Panel     Component Value Date/Time   CHOL 159 09/16/2024 1233   TRIG 142  09/16/2024 1233   HDL 50 09/16/2024 1233   CHOLHDL 3.2 05/19/2024 0838   LDLCALC 84 09/16/2024 1233   Hepatic Function Panel     Component Value Date/Time   PROT 7.3 09/16/2024 1233   ALBUMIN 4.2 09/16/2024 1233   AST 20 09/16/2024 1233   ALT 19 09/16/2024 1233   ALKPHOS 96 09/16/2024 1233   BILITOT 0.8 09/16/2024 1233   BILIDIR 0.2 03/31/2009 0500   IBILI 1.1 (H) 03/31/2009 0500      Component Value Date/Time   TSH 1.700 05/19/2024 0838   Nutritional Lab Results  Component Value Date   VD25OH 32.2 09/16/2024   VD25OH 38.1 11/18/2023   VD25OH 39.5 10/31/2023     Assessment and Plan Assessment & Plan Morbid obesity due to excess calories Morbid obesity managed with a category three eating plan, followed 75% of the time. Weight is 272 pounds with recent weight loss. Not exercising due to a cold. Working on protein intake and hydration but skipping meals. - Continue category three eating plan  Vitamin D  deficiency Managed with ergocalciferol  50,000 IU weekly. Requests refill. - Refilled ergocalciferol  50,000 IU weekly  Prediabetes Managed with metformin , diet, and exercise. Recent lapse in metformin  due to running out of medication, but resumed. Weight loss noted despite dietary indulgence. - Continue metformin  - Continue diet and exercise regimen      Patients who are on anti-obesity medications are counseled on the importance of maintaining healthy lifestyle habits, including balanced nutrition, regular physical activity, and behavioral modifications,  Medication is an adjunct to, not a replacement for, lifestyle changes and that the long-term success and weight maintenance depend on continued adherence to these strategies.   Brian Steele was informed of the importance of frequent follow up visits to maximize his success with intensive lifestyle modifications for his obesity and obesity related health conditions as recommended by USPSTF and CMS guidelines  [name],  MD   "

## 2024-12-16 ENCOUNTER — Ambulatory Visit (INDEPENDENT_AMBULATORY_CARE_PROVIDER_SITE_OTHER): Admitting: Family Medicine

## 2024-12-16 ENCOUNTER — Encounter (INDEPENDENT_AMBULATORY_CARE_PROVIDER_SITE_OTHER): Payer: Self-pay | Admitting: Family Medicine

## 2024-12-16 VITALS — BP 151/95 | HR 74 | Temp 97.4°F | Ht 66.0 in | Wt 268.0 lb

## 2024-12-16 DIAGNOSIS — F5089 Other specified eating disorder: Secondary | ICD-10-CM

## 2024-12-16 DIAGNOSIS — I10 Essential (primary) hypertension: Secondary | ICD-10-CM

## 2024-12-16 DIAGNOSIS — E669 Obesity, unspecified: Secondary | ICD-10-CM

## 2024-12-16 DIAGNOSIS — F3289 Other specified depressive episodes: Secondary | ICD-10-CM

## 2024-12-16 DIAGNOSIS — E559 Vitamin D deficiency, unspecified: Secondary | ICD-10-CM | POA: Diagnosis not present

## 2024-12-16 DIAGNOSIS — Z6841 Body Mass Index (BMI) 40.0 and over, adult: Secondary | ICD-10-CM | POA: Diagnosis not present

## 2024-12-16 MED ORDER — BUPROPION HCL ER (SR) 150 MG PO TB12: 150.0000 mg | ORAL_TABLET | Freq: Every morning | ORAL | 0 refills | Status: AC

## 2024-12-16 MED ORDER — ENALAPRIL MALEATE 2.5 MG PO TABS: 2.5000 mg | ORAL_TABLET | Freq: Every day | ORAL | 0 refills | Status: AC

## 2024-12-16 MED ORDER — VITAMIN D (ERGOCALCIFEROL) 1.25 MG (50000 UNIT) PO CAPS: 50000.0000 [IU] | ORAL_CAPSULE | ORAL | 0 refills | Status: AC

## 2024-12-16 NOTE — Progress Notes (Signed)
 "  Office: [phone]  /  Fax: [phone]  WEIGHT SUMMARY AND BIOMETRICS  Anthropometric Measurements Height: 5' 6 (1.676 m) Weight: 268 lb (121.6 kg) BMI (Calculated): 43.28 Weight at Last Visit: 268 lb Weight Lost Since Last Visit: 0 Weight Gained Since Last Visit: 0 Starting Weight: 288 lb Total Weight Loss (lbs): 20 lb (9.072 kg) Peak Weight: 302 lb   Body Composition  Body Fat %: 39.1 % Fat Mass (lbs): 105 lbs Muscle Mass (lbs): 155.6 lbs Total Body Water (lbs): 116.8 lbs Visceral Fat Rating : 27   Other Clinical Data Fasting: no Labs: no Today's Visit #: 21 Starting Date: 04/16/23    Chief Complaint: OBESITY    History of Present Illness Brian Steele is a 64 year old male who presents for obesity treatment.  He is following the category three eating plan about fifty percent of the time and has not been exercising. Despite this, he has maintained his weight over the past two weeks. He has no snacks in the house during a recent snowstorm and has been preparing meals from what was available in the freezer and refrigerator. He plans to go shopping once he has used up the remaining meat supply.  He is not currently on any antihypertensive medications, as he has been focusing on diet, exercise, and weight loss to manage his blood pressure.  He has been sick for the last week with symptoms including coughing, sneezing, rhinorrhea, and wheezing, but no fever. He received a flu shot and is cautious about not spreading illnesses to his grandchildren.  He is currently taking vitamin D  and bupropion , and he has plenty of metformin  left.      PHYSICAL EXAM:  Blood pressure (!) 151/95, pulse 74, temperature (!) 97.4 F (36.3 C), height 5' 6 (1.676 m), weight 268 lb (121.6 kg), SpO2 94%. Body mass index is 43.26 kg/m.  DIAGNOSTIC DATA REVIEWED BY MYSELF TODAY:  BMET    Component Value Date/Time   NA 140 09/16/2024 1233   K 4.3 09/16/2024 1233   CL 104  09/16/2024 1233   CO2 21 09/16/2024 1233   GLUCOSE 86 09/16/2024 1233   GLUCOSE 116 (H) 03/30/2009 1255   BUN 17 09/16/2024 1233   CREATININE 1.01 09/16/2024 1233   CALCIUM 9.3 09/16/2024 1233   GFRNONAA 80 06/22/2020 1219   GFRAA 93 06/22/2020 1219   Lab Results  Component Value Date   HGBA1C 5.4 09/16/2024   HGBA1C 6.1 01/18/2021   Lab Results  Component Value Date   INSULIN  21.5 09/16/2024   INSULIN  12.5 04/16/2023   Lab Results  Component Value Date   TSH 1.700 05/19/2024   CBC    Component Value Date/Time   WBC 9.6 09/16/2024 1233   WBC 9.8 12/11/2009 1446   RBC 5.14 09/16/2024 1233   RBC 5.46 (A) 02/24/2023 0000   HGB 14.8 09/16/2024 1233   HCT 44.4 09/16/2024 1233   PLT 315 09/16/2024 1233   MCV 86 09/16/2024 1233   MCH 28.8 09/16/2024 1233   MCHC 33.3 09/16/2024 1233   MCHC 34.5 12/11/2009 1446   RDW 13.8 09/16/2024 1233   Iron Studies    Component Value Date/Time   IRON 57 10/31/2023 0837   IRON 54 02/25/2023 0000   TIBC 309 10/31/2023 0837   TIBC 405.00 02/25/2023 0000   FERRITIN 138 10/31/2023 0837   FERRITIN 69.30 02/25/2023 0000   IRONPCTSAT 18 10/31/2023 0837   Lipid Panel     Component Value Date/Time  CHOL 159 09/16/2024 1233   TRIG 142 09/16/2024 1233   HDL 50 09/16/2024 1233   CHOLHDL 3.2 05/19/2024 0838   LDLCALC 84 09/16/2024 1233   Hepatic Function Panel     Component Value Date/Time   PROT 7.3 09/16/2024 1233   ALBUMIN 4.2 09/16/2024 1233   AST 20 09/16/2024 1233   ALT 19 09/16/2024 1233   ALKPHOS 96 09/16/2024 1233   BILITOT 0.8 09/16/2024 1233   BILIDIR 0.2 03/31/2009 0500   IBILI 1.1 (H) 03/31/2009 0500      Component Value Date/Time   TSH 1.700 05/19/2024 0838   Nutritional Lab Results  Component Value Date   VD25OH 32.2 09/16/2024   VD25OH 38.1 11/18/2023   VD25OH 39.5 10/31/2023     Assessment and Plan Assessment & Plan Obesity with EEB Managed with a category three eating plan, followed 50% of the  time. No current exercise regimen. Weight maintained over the last two weeks. - Provided meal prepping recipe packet to assist with dietary management. - Encouraged adherence to the category three eating plan. - Discussed the importance of maintaining caloric intake between 500-700 calories per meal with at least 40 grams of protein. - Refill Wellbutrin    Essential hypertension Blood pressure elevated at 142/81 and 151/95. Not on antihypertensive medications. Discussed risks of untreated hypertension, including heart attack and stroke. Enalapril  chosen for its heart-friendly properties and renal protection. Discussed potential side effects, including lightheadedness and rare tickly cough. - Initiated enalapril  2.5 mg daily. - Instructed to monitor for side effects such as lightheadedness and tickly cough.  Vitamin D  deficiency Managed with ergocalciferol  50,000 IU weekly. - Refilled ergocalciferol  50,000 IU weekly.      Patients who are on anti-obesity medications are counseled on the importance of maintaining healthy lifestyle habits, including balanced nutrition, regular physical activity, and behavioral modifications,  Medication is an adjunct to, not a replacement for, lifestyle changes and that the long-term success and weight maintenance depend on continued adherence to these strategies.   [name] was informed of the importance of frequent follow up visits to maximize his success with intensive lifestyle modifications for his obesity and obesity related health conditions as recommended by USPSTF and CMS guidelines  [name], MD   "

## 2024-12-30 ENCOUNTER — Ambulatory Visit (INDEPENDENT_AMBULATORY_CARE_PROVIDER_SITE_OTHER): Admitting: Family Medicine

## 2025-01-11 ENCOUNTER — Ambulatory Visit: Admitting: Cardiology

## 2025-01-17 ENCOUNTER — Ambulatory Visit (INDEPENDENT_AMBULATORY_CARE_PROVIDER_SITE_OTHER): Admitting: Family Medicine

## 2025-02-17 ENCOUNTER — Ambulatory Visit (INDEPENDENT_AMBULATORY_CARE_PROVIDER_SITE_OTHER): Admitting: Family Medicine

## 2025-02-21 ENCOUNTER — Ambulatory Visit: Payer: Self-pay
